# Patient Record
Sex: Male | Born: 1950 | Race: Black or African American | Hispanic: No | Marital: Married | Smoking: Current every day smoker
Health system: Southern US, Community
[De-identification: ages and names within clinical notes are randomized; demographics above are authoritative.]

## PROBLEM LIST (undated history)

## (undated) DIAGNOSIS — J42 Unspecified chronic bronchitis: Secondary | ICD-10-CM

## (undated) DIAGNOSIS — I480 Paroxysmal atrial fibrillation: Secondary | ICD-10-CM

## (undated) DIAGNOSIS — I4892 Unspecified atrial flutter: Secondary | ICD-10-CM

## (undated) DIAGNOSIS — Z720 Tobacco use: Secondary | ICD-10-CM

## (undated) DIAGNOSIS — I351 Nonrheumatic aortic (valve) insufficiency: Secondary | ICD-10-CM

## (undated) DIAGNOSIS — I2510 Atherosclerotic heart disease of native coronary artery without angina pectoris: Secondary | ICD-10-CM

## (undated) DIAGNOSIS — E785 Hyperlipidemia, unspecified: Secondary | ICD-10-CM

## (undated) DIAGNOSIS — N182 Chronic kidney disease, stage 2 (mild): Secondary | ICD-10-CM

## (undated) DIAGNOSIS — IMO0001 Reserved for inherently not codable concepts without codable children: Secondary | ICD-10-CM

## (undated) DIAGNOSIS — J449 Chronic obstructive pulmonary disease, unspecified: Secondary | ICD-10-CM

## (undated) DIAGNOSIS — I712 Thoracic aortic aneurysm, without rupture: Secondary | ICD-10-CM

## (undated) DIAGNOSIS — I509 Heart failure, unspecified: Secondary | ICD-10-CM

## (undated) DIAGNOSIS — I429 Cardiomyopathy, unspecified: Secondary | ICD-10-CM

## (undated) DIAGNOSIS — I7121 Aneurysm of the ascending aorta, without rupture: Secondary | ICD-10-CM

## (undated) DIAGNOSIS — I10 Essential (primary) hypertension: Secondary | ICD-10-CM

## (undated) HISTORY — DX: Tobacco use: Z72.0

## (undated) HISTORY — DX: Unspecified atrial flutter: I48.92

## (undated) HISTORY — DX: Paroxysmal atrial fibrillation: I48.0

## (undated) HISTORY — DX: Unspecified chronic bronchitis: J42

## (undated) HISTORY — DX: Cardiomyopathy, unspecified: I42.9

## (undated) HISTORY — DX: Aneurysm of the ascending aorta, without rupture: I71.21

## (undated) HISTORY — DX: Atherosclerotic heart disease of native coronary artery without angina pectoris: I25.10

## (undated) HISTORY — DX: Essential (primary) hypertension: I10

## (undated) HISTORY — DX: Thoracic aortic aneurysm, without rupture: I71.2

## (undated) HISTORY — DX: Heart failure, unspecified: I50.9

## (undated) HISTORY — DX: Chronic kidney disease, stage 2 (mild): N18.2

## (undated) HISTORY — DX: Nonrheumatic aortic (valve) insufficiency: I35.1

## (undated) HISTORY — DX: Chronic obstructive pulmonary disease, unspecified: J44.9

## (undated) HISTORY — DX: Hyperlipidemia, unspecified: E78.5

---

## 2002-10-19 HISTORY — PX: ESOPHAGOGASTRODUODENOSCOPY: SHX1529

## 2002-10-19 HISTORY — PX: COLONOSCOPY W/ POLYPECTOMY: SHX1380

## 2003-08-13 ENCOUNTER — Encounter: Payer: Self-pay | Admitting: Family Medicine

## 2003-08-13 LAB — CONVERTED CEMR LAB: Hgb A1c MFr Bld: 5.8 %

## 2005-05-12 ENCOUNTER — Ambulatory Visit: Payer: Self-pay | Admitting: Family Medicine

## 2005-05-15 ENCOUNTER — Ambulatory Visit: Payer: Self-pay | Admitting: Family Medicine

## 2005-05-15 LAB — CONVERTED CEMR LAB: PSA: 0.77 ng/mL

## 2005-05-19 ENCOUNTER — Ambulatory Visit: Payer: Self-pay | Admitting: Family Medicine

## 2005-05-27 ENCOUNTER — Ambulatory Visit: Payer: Self-pay | Admitting: Family Medicine

## 2005-07-17 ENCOUNTER — Ambulatory Visit: Payer: Self-pay | Admitting: Family Medicine

## 2005-10-14 ENCOUNTER — Ambulatory Visit: Payer: Self-pay | Admitting: Family Medicine

## 2005-10-14 LAB — CONVERTED CEMR LAB: Hgb A1c MFr Bld: 5.7 %

## 2005-10-15 ENCOUNTER — Ambulatory Visit: Payer: Self-pay | Admitting: Family Medicine

## 2006-01-15 ENCOUNTER — Ambulatory Visit: Payer: Self-pay | Admitting: Family Medicine

## 2006-05-12 ENCOUNTER — Ambulatory Visit: Payer: Self-pay | Admitting: Family Medicine

## 2006-05-12 LAB — CONVERTED CEMR LAB
Hgb A1c MFr Bld: 5.9 %
PSA: 0.65 ng/mL

## 2006-08-17 ENCOUNTER — Ambulatory Visit: Payer: Self-pay | Admitting: Family Medicine

## 2006-09-28 ENCOUNTER — Emergency Department: Payer: Self-pay | Admitting: Emergency Medicine

## 2006-09-29 HISTORY — PX: CT ABD W & PELVIS WO CM: HXRAD294

## 2006-09-29 HISTORY — PX: OTHER SURGICAL HISTORY: SHX169

## 2006-10-27 ENCOUNTER — Encounter: Payer: Self-pay | Admitting: Family Medicine

## 2006-10-27 DIAGNOSIS — F17200 Nicotine dependence, unspecified, uncomplicated: Secondary | ICD-10-CM

## 2006-10-27 DIAGNOSIS — E119 Type 2 diabetes mellitus without complications: Secondary | ICD-10-CM

## 2006-10-27 DIAGNOSIS — M1710 Unilateral primary osteoarthritis, unspecified knee: Secondary | ICD-10-CM | POA: Insufficient documentation

## 2006-10-27 DIAGNOSIS — E785 Hyperlipidemia, unspecified: Secondary | ICD-10-CM | POA: Insufficient documentation

## 2006-10-27 DIAGNOSIS — I10 Essential (primary) hypertension: Secondary | ICD-10-CM

## 2006-10-27 DIAGNOSIS — E669 Obesity, unspecified: Secondary | ICD-10-CM | POA: Insufficient documentation

## 2006-10-27 DIAGNOSIS — IMO0002 Reserved for concepts with insufficient information to code with codable children: Secondary | ICD-10-CM

## 2006-10-27 DIAGNOSIS — A048 Other specified bacterial intestinal infections: Secondary | ICD-10-CM | POA: Insufficient documentation

## 2006-10-28 ENCOUNTER — Ambulatory Visit: Payer: Self-pay | Admitting: Family Medicine

## 2006-11-06 DIAGNOSIS — D126 Benign neoplasm of colon, unspecified: Secondary | ICD-10-CM

## 2006-12-01 ENCOUNTER — Ambulatory Visit: Payer: Self-pay | Admitting: Family Medicine

## 2006-12-01 LAB — CONVERTED CEMR LAB
ALT: 18 units/L (ref 0–40)
AST: 17 units/L (ref 0–37)
BUN: 15 mg/dL (ref 6–23)
CO2: 33 meq/L — ABNORMAL HIGH (ref 19–32)
Calcium: 9.2 mg/dL (ref 8.4–10.5)
Creatinine, Ser: 1.3 mg/dL (ref 0.4–1.5)
Hgb A1c MFr Bld: 5.9 % (ref 4.6–6.0)
Potassium: 4.1 meq/L (ref 3.5–5.1)
Triglycerides: 74 mg/dL (ref 0–149)
VLDL: 15 mg/dL (ref 0–40)

## 2006-12-08 ENCOUNTER — Ambulatory Visit: Payer: Self-pay | Admitting: Family Medicine

## 2007-02-03 ENCOUNTER — Ambulatory Visit: Payer: Self-pay | Admitting: Family Medicine

## 2007-02-10 ENCOUNTER — Encounter (INDEPENDENT_AMBULATORY_CARE_PROVIDER_SITE_OTHER): Payer: Self-pay | Admitting: *Deleted

## 2007-02-15 ENCOUNTER — Ambulatory Visit: Payer: Self-pay | Admitting: Family Medicine

## 2007-02-15 LAB — CONVERTED CEMR LAB
Calcium: 9.2 mg/dL (ref 8.4–10.5)
Chloride: 105 meq/L (ref 96–112)
GFR calc Af Amer: 58 mL/min
GFR calc non Af Amer: 48 mL/min
Glucose, Bld: 93 mg/dL (ref 70–99)
Sodium: 141 meq/L (ref 135–145)

## 2007-02-16 ENCOUNTER — Other Ambulatory Visit: Payer: Self-pay

## 2007-02-17 ENCOUNTER — Inpatient Hospital Stay: Payer: Self-pay | Admitting: *Deleted

## 2007-02-17 DIAGNOSIS — I428 Other cardiomyopathies: Secondary | ICD-10-CM | POA: Insufficient documentation

## 2007-02-18 ENCOUNTER — Other Ambulatory Visit: Payer: Self-pay

## 2007-02-19 ENCOUNTER — Other Ambulatory Visit: Payer: Self-pay

## 2007-02-22 ENCOUNTER — Other Ambulatory Visit: Payer: Self-pay

## 2007-02-24 ENCOUNTER — Encounter: Payer: Self-pay | Admitting: Family Medicine

## 2007-02-28 ENCOUNTER — Telehealth (INDEPENDENT_AMBULATORY_CARE_PROVIDER_SITE_OTHER): Payer: Self-pay | Admitting: *Deleted

## 2007-02-28 ENCOUNTER — Ambulatory Visit: Payer: Self-pay | Admitting: Family Medicine

## 2007-02-28 DIAGNOSIS — I4891 Unspecified atrial fibrillation: Secondary | ICD-10-CM | POA: Insufficient documentation

## 2007-02-28 LAB — CONVERTED CEMR LAB: Prothrombin Time: 13.6 s

## 2007-03-02 ENCOUNTER — Encounter: Payer: Self-pay | Admitting: Family Medicine

## 2007-03-02 ENCOUNTER — Ambulatory Visit: Payer: Self-pay | Admitting: Internal Medicine

## 2007-03-03 ENCOUNTER — Ambulatory Visit: Payer: Self-pay | Admitting: Family Medicine

## 2007-03-03 ENCOUNTER — Telehealth (INDEPENDENT_AMBULATORY_CARE_PROVIDER_SITE_OTHER): Payer: Self-pay | Admitting: *Deleted

## 2007-03-03 DIAGNOSIS — R5383 Other fatigue: Secondary | ICD-10-CM

## 2007-03-03 DIAGNOSIS — R5381 Other malaise: Secondary | ICD-10-CM | POA: Insufficient documentation

## 2007-03-04 ENCOUNTER — Ambulatory Visit: Payer: Self-pay | Admitting: Internal Medicine

## 2007-03-06 LAB — CONVERTED CEMR LAB
ALT: 28 U/L
AST: 26 U/L
Albumin: 3.6 g/dL
Alkaline Phosphatase: 80 U/L
BUN: 19 mg/dL
Bilirubin, Direct: 0.1 mg/dL
CO2: 30 meq/L
Calcium: 9.1 mg/dL
Chloride: 107 meq/L
Cholesterol: 157 mg/dL
Creatinine, Ser: 1.5 mg/dL
GFR calc Af Amer: 63 mL/min
GFR calc non Af Amer: 52 mL/min
Glucose, Bld: 93 mg/dL
HDL: 27.7 mg/dL — ABNORMAL LOW
Hgb A1c MFr Bld: 6 %
LDL Cholesterol: 113 mg/dL — ABNORMAL HIGH
Magnesium: 2.3 mg/dL
Phosphorus: 4.2 mg/dL
Potassium: 4.3 meq/L
Sodium: 142 meq/L
TSH: 1.21 u[IU]/mL
Total Bilirubin: 0.6 mg/dL
Total CHOL/HDL Ratio: 5.7
Total Protein: 6.8 g/dL
Triglycerides: 83 mg/dL
VLDL: 17 mg/dL

## 2007-03-07 ENCOUNTER — Ambulatory Visit: Payer: Self-pay | Admitting: Family Medicine

## 2007-03-07 LAB — CONVERTED CEMR LAB
INR: 1.2
Prothrombin Time: 12.6 s

## 2007-03-11 ENCOUNTER — Encounter: Payer: Self-pay | Admitting: Family Medicine

## 2007-03-14 ENCOUNTER — Encounter (INDEPENDENT_AMBULATORY_CARE_PROVIDER_SITE_OTHER): Payer: Self-pay | Admitting: *Deleted

## 2007-03-23 ENCOUNTER — Ambulatory Visit: Payer: Self-pay | Admitting: Family Medicine

## 2007-03-23 LAB — CONVERTED CEMR LAB
INR: 0.9
Prothrombin Time: 11.9 s

## 2007-03-25 ENCOUNTER — Telehealth: Payer: Self-pay | Admitting: Family Medicine

## 2007-03-28 ENCOUNTER — Ambulatory Visit: Payer: Self-pay | Admitting: Family Medicine

## 2007-04-05 ENCOUNTER — Telehealth (INDEPENDENT_AMBULATORY_CARE_PROVIDER_SITE_OTHER): Payer: Self-pay | Admitting: *Deleted

## 2007-04-05 ENCOUNTER — Ambulatory Visit: Payer: Self-pay | Admitting: Family Medicine

## 2007-04-05 LAB — CONVERTED CEMR LAB: Prothrombin Time: 12.3 s

## 2007-04-19 ENCOUNTER — Ambulatory Visit: Payer: Self-pay | Admitting: Family Medicine

## 2007-04-20 LAB — CONVERTED CEMR LAB
INR: 0.9
Prothrombin Time: 11.3 s

## 2007-04-25 ENCOUNTER — Ambulatory Visit: Payer: Self-pay | Admitting: Family Medicine

## 2007-04-25 LAB — CONVERTED CEMR LAB
Creatinine, Ser: 1.2 mg/dL (ref 0.4–1.5)
GFR calc non Af Amer: 67 mL/min
Glucose, Bld: 100 mg/dL — ABNORMAL HIGH (ref 70–99)
Hgb A1c MFr Bld: 6.5 % — ABNORMAL HIGH (ref 4.6–6.0)
Potassium: 3.9 meq/L (ref 3.5–5.1)
Sodium: 142 meq/L (ref 135–145)

## 2007-04-27 ENCOUNTER — Ambulatory Visit: Payer: Self-pay | Admitting: Family Medicine

## 2007-04-28 ENCOUNTER — Telehealth: Payer: Self-pay | Admitting: Family Medicine

## 2007-04-28 ENCOUNTER — Ambulatory Visit: Payer: Self-pay | Admitting: Family Medicine

## 2007-05-11 ENCOUNTER — Ambulatory Visit: Payer: Self-pay | Admitting: Family Medicine

## 2007-05-11 LAB — CONVERTED CEMR LAB: INR: 1.1

## 2007-05-25 ENCOUNTER — Ambulatory Visit: Payer: Self-pay | Admitting: Family Medicine

## 2007-05-25 LAB — CONVERTED CEMR LAB: INR: 1.1

## 2007-05-31 ENCOUNTER — Ambulatory Visit: Payer: Self-pay | Admitting: Family Medicine

## 2007-05-31 LAB — CONVERTED CEMR LAB
INR: 1.3
Prothrombin Time: 14.1 s

## 2007-06-07 ENCOUNTER — Ambulatory Visit: Payer: Self-pay | Admitting: Family Medicine

## 2007-06-07 LAB — CONVERTED CEMR LAB

## 2007-06-10 ENCOUNTER — Ambulatory Visit: Payer: Self-pay | Admitting: Internal Medicine

## 2007-06-10 ENCOUNTER — Encounter: Payer: Self-pay | Admitting: Family Medicine

## 2007-06-14 ENCOUNTER — Ambulatory Visit: Payer: Self-pay | Admitting: Family Medicine

## 2007-06-15 ENCOUNTER — Encounter: Payer: Self-pay | Admitting: Family Medicine

## 2007-06-15 HISTORY — PX: OTHER SURGICAL HISTORY: SHX169

## 2007-06-30 ENCOUNTER — Ambulatory Visit: Payer: Self-pay | Admitting: Family Medicine

## 2007-07-12 ENCOUNTER — Ambulatory Visit: Payer: Self-pay | Admitting: Family Medicine

## 2007-07-12 LAB — CONVERTED CEMR LAB: INR: 2

## 2007-09-14 ENCOUNTER — Ambulatory Visit: Payer: Self-pay | Admitting: Family Medicine

## 2007-09-14 LAB — CONVERTED CEMR LAB: Prothrombin Time: 15.8 s

## 2007-12-22 ENCOUNTER — Ambulatory Visit: Payer: Self-pay | Admitting: Family Medicine

## 2007-12-22 LAB — CONVERTED CEMR LAB
AST: 20 units/L (ref 0–37)
Albumin: 4 g/dL (ref 3.5–5.2)
BUN: 9 mg/dL (ref 6–23)
Basophils Absolute: 0 10*3/uL (ref 0.0–0.1)
Basophils Relative: 0.4 % (ref 0.0–1.0)
Calcium: 9.3 mg/dL (ref 8.4–10.5)
Cholesterol: 182 mg/dL (ref 0–200)
Creatinine, Ser: 1.4 mg/dL (ref 0.4–1.5)
Creatinine,U: 26.1 mg/dL
Eosinophils Absolute: 0.1 10*3/uL (ref 0.0–0.7)
Eosinophils Relative: 1.7 % (ref 0.0–5.0)
GFR calc non Af Amer: 56 mL/min
HCT: 49.4 % (ref 39.0–52.0)
Hemoglobin: 17 g/dL (ref 13.0–17.0)
Hgb A1c MFr Bld: 6.2 % — ABNORMAL HIGH (ref 4.6–6.0)
MCHC: 34.5 g/dL (ref 30.0–36.0)
MCV: 95.3 fL (ref 78.0–100.0)
Microalb, Ur: 5.2 mg/dL — ABNORMAL HIGH (ref 0.0–1.9)
Monocytes Absolute: 0.6 10*3/uL (ref 0.1–1.0)
Neutro Abs: 3.8 10*3/uL (ref 1.4–7.7)
Neutrophils Relative %: 55.7 % (ref 43.0–77.0)
PSA: 0.91 ng/mL (ref 0.10–4.00)
RBC: 5.19 M/uL (ref 4.22–5.81)
Total Bilirubin: 0.8 mg/dL (ref 0.3–1.2)
VLDL: 34 mg/dL (ref 0–40)
WBC: 6.8 10*3/uL (ref 4.5–10.5)

## 2007-12-26 ENCOUNTER — Ambulatory Visit: Payer: Self-pay | Admitting: Family Medicine

## 2008-06-23 ENCOUNTER — Inpatient Hospital Stay: Payer: Self-pay | Admitting: Specialist

## 2008-06-25 ENCOUNTER — Encounter: Payer: Self-pay | Admitting: Family Medicine

## 2008-06-27 ENCOUNTER — Ambulatory Visit: Payer: Self-pay | Admitting: Family Medicine

## 2008-06-27 LAB — CONVERTED CEMR LAB: INR: 1.4

## 2008-06-28 LAB — CONVERTED CEMR LAB
Creatinine,U: 280.5 mg/dL
Hgb A1c MFr Bld: 6.1 % — ABNORMAL HIGH (ref 4.6–6.0)
Microalb Creat Ratio: 84.1 mg/g — ABNORMAL HIGH (ref 0.0–30.0)

## 2008-07-04 ENCOUNTER — Ambulatory Visit: Payer: Self-pay | Admitting: Family Medicine

## 2008-07-04 LAB — CONVERTED CEMR LAB: Prothrombin Time: 19.5 s
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
## 2008-08-03 ENCOUNTER — Ambulatory Visit: Payer: Self-pay | Admitting: Family Medicine

## 2008-08-03 ENCOUNTER — Telehealth: Payer: Self-pay | Admitting: Family Medicine

## 2008-08-03 LAB — CONVERTED CEMR LAB
INR: 1.2
Prothrombin Time: 13.7 s

## 2008-08-09 ENCOUNTER — Encounter: Payer: Self-pay | Admitting: Family Medicine

## 2008-08-15 ENCOUNTER — Ambulatory Visit: Payer: Self-pay | Admitting: Family Medicine

## 2008-09-05 ENCOUNTER — Ambulatory Visit: Payer: Self-pay | Admitting: Family Medicine

## 2008-09-05 LAB — CONVERTED CEMR LAB
INR: 1.7
Prothrombin Time: 15.9 s

## 2008-09-19 ENCOUNTER — Ambulatory Visit: Payer: Self-pay | Admitting: Family Medicine

## 2008-09-19 LAB — CONVERTED CEMR LAB: INR: 1.8

## 2008-10-03 ENCOUNTER — Ambulatory Visit: Payer: Self-pay | Admitting: Family Medicine

## 2008-10-03 LAB — CONVERTED CEMR LAB
INR: 1.3
Prothrombin Time: 14.3 s

## 2008-10-04 ENCOUNTER — Emergency Department: Payer: Self-pay | Admitting: Emergency Medicine

## 2008-10-04 ENCOUNTER — Ambulatory Visit: Payer: Self-pay | Admitting: Family Medicine

## 2008-10-04 DIAGNOSIS — R0602 Shortness of breath: Secondary | ICD-10-CM

## 2008-10-12 ENCOUNTER — Inpatient Hospital Stay: Payer: Self-pay | Admitting: Internal Medicine

## 2008-10-15 ENCOUNTER — Encounter: Payer: Self-pay | Admitting: Family Medicine

## 2008-10-22 ENCOUNTER — Encounter: Payer: Self-pay | Admitting: Family Medicine

## 2008-10-23 ENCOUNTER — Encounter: Payer: Self-pay | Admitting: Family Medicine

## 2008-10-24 ENCOUNTER — Ambulatory Visit: Payer: Self-pay | Admitting: Family Medicine

## 2008-10-24 LAB — CONVERTED CEMR LAB
CO2: 31 meq/L (ref 19–32)
Calcium: 8.9 mg/dL (ref 8.4–10.5)
GFR calc non Af Amer: 57.85 mL/min (ref >60)
Glucose, Bld: 104 mg/dL — ABNORMAL HIGH (ref 70–99)
Potassium: 4.1 meq/L (ref 3.5–5.1)
Pro B Natriuretic peptide (BNP): 560 pg/mL — ABNORMAL HIGH (ref 0.0–100.0)
Sodium: 144 meq/L (ref 135–145)

## 2008-10-25 ENCOUNTER — Ambulatory Visit: Payer: Self-pay | Admitting: Cardiology

## 2008-10-25 LAB — CONVERTED CEMR LAB
Beta Globulin: 6 % (ref 4.7–7.2)
Gamma Globulin: 13.9 % (ref 11.1–18.8)
IgA: 312 mg/dL (ref 68–378)
IgG (Immunoglobin G), Serum: 834 mg/dL (ref 694–1618)
TSH: 1.319 microintl units/mL (ref 0.350–4.500)
Total Protein, Serum Electrophoresis: 6 g/dL (ref 6.0–8.3)

## 2008-10-26 ENCOUNTER — Ambulatory Visit: Payer: Self-pay | Admitting: Nephrology

## 2008-10-29 ENCOUNTER — Ambulatory Visit: Payer: Self-pay | Admitting: Internal Medicine

## 2008-10-31 ENCOUNTER — Ambulatory Visit: Payer: Self-pay | Admitting: Family Medicine

## 2008-10-31 LAB — CONVERTED CEMR LAB: Prothrombin Time: 15.4 s

## 2008-11-07 ENCOUNTER — Ambulatory Visit: Payer: Self-pay | Admitting: Cardiology

## 2008-11-12 ENCOUNTER — Ambulatory Visit: Payer: Self-pay | Admitting: Internal Medicine

## 2008-11-12 ENCOUNTER — Encounter: Payer: Self-pay | Admitting: Cardiology

## 2008-11-12 LAB — CONVERTED CEMR LAB
BUN: 9 mg/dL (ref 6–23)
Calcium: 8.9 mg/dL (ref 8.4–10.5)
Creatinine, Ser: 1.32 mg/dL (ref 0.40–1.50)
INR: 1.6 — ABNORMAL HIGH (ref 0.0–1.5)
MCHC: 32.2 g/dL (ref 30.0–36.0)
MCV: 94.8 fL (ref 78.0–100.0)
Platelets: 276 10*3/uL (ref 150–400)
Prothrombin Time: 19.3 s — ABNORMAL HIGH (ref 11.6–15.2)

## 2008-11-14 ENCOUNTER — Inpatient Hospital Stay (HOSPITAL_COMMUNITY): Admission: AD | Admit: 2008-11-14 | Discharge: 2008-11-16 | Payer: Self-pay | Admitting: Internal Medicine

## 2008-11-14 ENCOUNTER — Telehealth: Payer: Self-pay | Admitting: Family Medicine

## 2008-11-14 ENCOUNTER — Encounter: Payer: Self-pay | Admitting: Internal Medicine

## 2008-11-14 ENCOUNTER — Ambulatory Visit: Payer: Self-pay | Admitting: Internal Medicine

## 2008-11-14 HISTORY — PX: TEE WITHOUT CARDIOVERSION: SHX5443

## 2008-11-21 ENCOUNTER — Telehealth: Payer: Self-pay | Admitting: Family Medicine

## 2008-11-22 ENCOUNTER — Ambulatory Visit: Payer: Self-pay | Admitting: Cardiology

## 2008-11-22 ENCOUNTER — Encounter (INDEPENDENT_AMBULATORY_CARE_PROVIDER_SITE_OTHER): Payer: Self-pay | Admitting: *Deleted

## 2008-12-05 ENCOUNTER — Ambulatory Visit: Payer: Self-pay | Admitting: Family Medicine

## 2008-12-05 LAB — CONVERTED CEMR LAB
INR: 1.1
Prothrombin Time: 13.2 s

## 2008-12-06 ENCOUNTER — Ambulatory Visit: Payer: Self-pay | Admitting: Cardiology

## 2008-12-11 ENCOUNTER — Ambulatory Visit: Payer: Self-pay | Admitting: Family Medicine

## 2008-12-11 LAB — CONVERTED CEMR LAB
INR: 1.3
Prothrombin Time: 14.2 s

## 2008-12-13 LAB — CONVERTED CEMR LAB
CO2: 26 meq/L (ref 19–32)
Calcium: 8.9 mg/dL (ref 8.4–10.5)
Creatinine, Ser: 1.19 mg/dL (ref 0.40–1.50)

## 2008-12-18 ENCOUNTER — Ambulatory Visit: Payer: Self-pay

## 2008-12-18 ENCOUNTER — Encounter: Payer: Self-pay | Admitting: Cardiology

## 2008-12-24 ENCOUNTER — Telehealth: Payer: Self-pay | Admitting: Cardiology

## 2008-12-25 ENCOUNTER — Ambulatory Visit: Payer: Self-pay | Admitting: Cardiology

## 2008-12-27 ENCOUNTER — Encounter: Payer: Self-pay | Admitting: Cardiology

## 2009-01-01 ENCOUNTER — Ambulatory Visit: Payer: Self-pay | Admitting: Cardiology

## 2009-01-07 ENCOUNTER — Ambulatory Visit: Payer: Self-pay | Admitting: Internal Medicine

## 2009-01-07 ENCOUNTER — Encounter: Payer: Self-pay | Admitting: Cardiology

## 2009-01-08 LAB — CONVERTED CEMR LAB
CO2: 23 meq/L (ref 19–32)
Calcium: 9.1 mg/dL (ref 8.4–10.5)
MCV: 92.5 fL (ref 78.0–100.0)
Potassium: 3.9 meq/L (ref 3.5–5.3)
Sodium: 141 meq/L (ref 135–145)
WBC: 6.4 10*3/uL (ref 4.0–10.5)

## 2009-01-10 ENCOUNTER — Ambulatory Visit: Payer: Self-pay | Admitting: Cardiovascular Disease

## 2009-01-10 ENCOUNTER — Encounter (INDEPENDENT_AMBULATORY_CARE_PROVIDER_SITE_OTHER): Payer: Self-pay | Admitting: *Deleted

## 2009-01-11 ENCOUNTER — Ambulatory Visit: Payer: Self-pay | Admitting: Cardiology

## 2009-01-11 ENCOUNTER — Ambulatory Visit (HOSPITAL_COMMUNITY): Admission: RE | Admit: 2009-01-11 | Discharge: 2009-01-11 | Payer: Self-pay | Admitting: Cardiology

## 2009-01-11 HISTORY — PX: CARDIAC CATHETERIZATION: SHX172

## 2009-01-17 ENCOUNTER — Ambulatory Visit: Payer: Self-pay | Admitting: Internal Medicine

## 2009-01-31 ENCOUNTER — Ambulatory Visit: Payer: Self-pay | Admitting: Cardiology

## 2009-02-01 ENCOUNTER — Encounter: Payer: Self-pay | Admitting: Internal Medicine

## 2009-02-14 ENCOUNTER — Ambulatory Visit: Payer: Self-pay | Admitting: Cardiology

## 2009-02-20 ENCOUNTER — Ambulatory Visit: Payer: Self-pay | Admitting: Gastroenterology

## 2009-02-21 ENCOUNTER — Encounter: Payer: Self-pay | Admitting: Cardiology

## 2009-02-27 ENCOUNTER — Telehealth: Payer: Self-pay | Admitting: Cardiology

## 2009-02-28 ENCOUNTER — Ambulatory Visit: Payer: Self-pay | Admitting: Cardiovascular Disease

## 2009-03-04 ENCOUNTER — Encounter: Payer: Self-pay | Admitting: *Deleted

## 2009-03-07 ENCOUNTER — Ambulatory Visit: Payer: Self-pay | Admitting: Cardiology

## 2009-03-07 DIAGNOSIS — I4892 Unspecified atrial flutter: Secondary | ICD-10-CM

## 2009-03-07 DIAGNOSIS — I359 Nonrheumatic aortic valve disorder, unspecified: Secondary | ICD-10-CM | POA: Insufficient documentation

## 2009-03-07 DIAGNOSIS — I719 Aortic aneurysm of unspecified site, without rupture: Secondary | ICD-10-CM | POA: Insufficient documentation

## 2009-03-09 LAB — CONVERTED CEMR LAB
CO2: 25 meq/L (ref 19–32)
Chloride: 105 meq/L (ref 96–112)
Creatinine, Ser: 1.27 mg/dL (ref 0.40–1.50)
HDL: 40 mg/dL (ref >39)
LDL Cholesterol: 95 mg/dL (ref 0–99)
Pro B Natriuretic peptide (BNP): 16.7 pg/mL (ref 0.0–100.0)
Total CHOL/HDL Ratio: 3.8

## 2009-03-11 ENCOUNTER — Telehealth (INDEPENDENT_AMBULATORY_CARE_PROVIDER_SITE_OTHER): Payer: Self-pay | Admitting: *Deleted

## 2009-03-12 ENCOUNTER — Encounter: Payer: Self-pay | Admitting: Cardiology

## 2009-03-14 ENCOUNTER — Encounter: Payer: Self-pay | Admitting: Family Medicine

## 2009-04-11 ENCOUNTER — Encounter: Payer: Self-pay | Admitting: Cardiology

## 2009-06-18 ENCOUNTER — Telehealth (INDEPENDENT_AMBULATORY_CARE_PROVIDER_SITE_OTHER): Payer: Self-pay | Admitting: *Deleted

## 2009-06-20 ENCOUNTER — Telehealth: Payer: Self-pay | Admitting: Cardiology

## 2009-06-20 ENCOUNTER — Ambulatory Visit: Payer: Self-pay | Admitting: Internal Medicine

## 2009-06-27 ENCOUNTER — Encounter: Payer: Self-pay | Admitting: Cardiology

## 2009-06-27 ENCOUNTER — Ambulatory Visit: Payer: Self-pay

## 2009-07-01 ENCOUNTER — Encounter: Payer: Self-pay | Admitting: Cardiology

## 2009-07-03 ENCOUNTER — Telehealth: Payer: Self-pay | Admitting: Cardiology

## 2009-07-03 ENCOUNTER — Ambulatory Visit: Payer: Self-pay | Admitting: Cardiology

## 2009-07-09 LAB — CONVERTED CEMR LAB
ALT: 13 units/L (ref 0–53)
AST: 14 units/L (ref 0–37)
Chloride: 103 meq/L (ref 96–112)
Creatinine, Ser: 1.28 mg/dL (ref 0.40–1.50)
LDL Cholesterol: 88 mg/dL (ref 0–99)
Total Bilirubin: 0.2 mg/dL — ABNORMAL LOW (ref 0.3–1.2)
Total CHOL/HDL Ratio: 4.1
VLDL: 23 mg/dL (ref 0–40)

## 2009-10-09 ENCOUNTER — Telehealth: Payer: Self-pay | Admitting: Cardiology

## 2009-12-17 ENCOUNTER — Telehealth: Payer: Self-pay | Admitting: Cardiology

## 2010-06-19 ENCOUNTER — Encounter: Payer: Self-pay | Admitting: Cardiology

## 2010-06-19 ENCOUNTER — Ambulatory Visit: Payer: Self-pay

## 2010-08-10 ENCOUNTER — Encounter: Payer: Self-pay | Admitting: Cardiology

## 2010-08-19 NOTE — Progress Notes (Signed)
Summary: Furosemide refill  Phone Note Refill Request Call back at Home Phone [phone] Message from:  Patient on Dec 17, 2009 11:03 AM  Refills Requested: Medication #1:  FUROSEMIDE 40 MG  TABS 1TWICE A DAY BY MOUTH [facility] ON [address] IN [place]  Initial call taken by: Harlon Flor,  Dec 17, 2009 11:03 AM  Follow-up for Phone Call        Rx refill sent to pharmacy electronically.  Attempted to notify pt.  LMOM rx refill sent to pharmacy. Follow-up by: Cloyde Reams RN,  Dec 17, 2009 12:11 PM    Prescriptions: FUROSEMIDE 40 MG  TABS (FUROSEMIDE) 1TWICE A DAY BY MOUTH  #60 x 6   Entered by:   Cloyde Reams RN   Authorized by:   Marca Ancona, MD   Signed by:   Cloyde Reams RN on 12/17/2009   Method used:   Electronically to        [facility] [address] (retail)       [address]       [place], [place]  [address]       Ph: [phone]       Fax: [phone]   RxID:   [id number]

## 2010-08-19 NOTE — Progress Notes (Signed)
Summary: Refill  Phone Note Refill Request Message from:  Patient on October 09, 2009 11:59 AM  Refills Requested: Medication #1:  SPIRONOLACTONE 25 MG TABS Take 1/2 tablet by mouth daily [facility]  Initial call taken by: [place],  October 09, 2009 11:59 AM    Prescriptions: SPIRONOLACTONE 25 MG TABS (SPIRONOLACTONE) Take 1/2 tablet by mouth daily  #15 x 6   Entered by:   Mercer Pod   Authorized by:   Marca Ancona, MD   Signed by:   Mercer Pod on 10/09/2009   Method used:   Electronically to        [facility] [address] (retail)       [address]       [place], [place]  [address]       Ph: [phone]       Fax: [phone]   RxID:   [id number]

## 2010-10-13 ENCOUNTER — Telehealth: Payer: Self-pay

## 2010-10-14 ENCOUNTER — Other Ambulatory Visit: Payer: Self-pay

## 2010-10-14 ENCOUNTER — Telehealth: Payer: Self-pay

## 2010-10-14 MED ORDER — AMLODIPINE BESYLATE 5 MG PO TABS: 5.0000 mg | ORAL_TABLET | Freq: Every day | ORAL | Status: DC

## 2010-10-14 NOTE — Telephone Encounter (Signed)
Patient needed a refill for norvasc 5 mg daily.  Told pharmacy need to have patient make an appointment with Dr. Shirlee Latch as soon as possible before any refills done.

## 2010-10-14 NOTE — Telephone Encounter (Signed)
Needs a refill on Norvasc 5 mg one tablet daily. Spoke with [facility] for the refill. The patient has made an appointment with Dr. Shirlee Latch.

## 2010-10-15 ENCOUNTER — Other Ambulatory Visit: Payer: Self-pay

## 2010-10-15 MED ORDER — AMLODIPINE BESYLATE 5 MG PO TABS: 5.0000 mg | ORAL_TABLET | Freq: Every day | ORAL | Status: DC

## 2010-10-29 LAB — BASIC METABOLIC PANEL
GFR calc non Af Amer: >60 mL/min (ref >60)
Glucose, Bld: 96 mg/dL (ref 70–99)
Potassium: 3.7 mEq/L (ref 3.5–5.1)
Sodium: 142 mEq/L (ref 135–145)

## 2010-10-29 LAB — CBC
HCT: 38.3 % — ABNORMAL LOW (ref 39.0–52.0)
HCT: 39.8 % (ref 39.0–52.0)
HCT: 43.3 % (ref 39.0–52.0)
Hemoglobin: 13.1 g/dL (ref 13.0–17.0)
Hemoglobin: 14.6 g/dL (ref 13.0–17.0)
MCHC: 34 g/dL (ref 30.0–36.0)
MCHC: 34.2 g/dL (ref 30.0–36.0)
MCV: 92.8 fL (ref 78.0–100.0)
MCV: 93 fL (ref 78.0–100.0)
Platelets: 164 10*3/uL (ref 150–400)
Platelets: 242 10*3/uL (ref 150–400)
RDW: 14.7 % (ref 11.5–15.5)
RDW: 14.9 % (ref 11.5–15.5)
RDW: 15.1 % (ref 11.5–15.5)

## 2010-10-29 LAB — PROTIME-INR
INR: 1.8 — ABNORMAL HIGH (ref 0.00–1.49)
INR: 2.2 — ABNORMAL HIGH (ref 0.00–1.49)

## 2010-10-29 LAB — HEPARIN LEVEL (UNFRACTIONATED): Heparin Unfractionated: 0.16 IU/mL — ABNORMAL LOW (ref 0.30–0.70)

## 2010-11-27 ENCOUNTER — Ambulatory Visit: Payer: Self-pay | Admitting: Cardiology

## 2010-12-02 NOTE — Op Note (Signed)
NAME:  Bobby Gray, Bobby Gray NO.:  [id number]   MEDICAL RECORD NO.:  [id number]          PATIENT TYPE:  OIB   LOCATION:  3735                         FACILITY:  [facility]   PHYSICIAN:  Duke Salvia, MD, FACCDATE OF BIRTH:  01/30/1952   DATE OF PROCEDURE:  11/14/2008  DATE OF DISCHARGE:                               OPERATIVE REPORT   PREOPERATIVE DIAGNOSIS:  Atrial flutter.   POSTOPERATIVE DIAGNOSIS:  Atrial flutter.   PROCEDURE:  Invasive electrophysiological study, substrate mapping, and  radiofrequency catheter ablation.   Following obtaining informed consent, the patient was brought to the  Electrophysiology Laboratory and placed on the fluoroscopic table in the  supine position.  After routine prep and drape, cardiac catheterization  was performed with local anesthesia and conscious sedation.  Noninvasive  blood pressure monitoring, transcutaneous oxygen saturation monitoring,  and end-tidal CO2 monitoring were performed continuously throughout the  procedure.  Following the procedure, the catheters were removed and the  patient was transferred to the holding area for sheath removal.   Catheters of 5-French quadripolar catheter was inserted via the left  femoral vein to the AV junction.   A 6-French octapolar catheter was inserted via the right femoral vein to  the coronary sinus.   A 7-French dual decapolar catheter was inserted via the femoral vein to  the right tricuspid annulus and the coronary sinus.   A 8-mm RF catheter was inserted via the right femoral vein to mapping  sites in the isthmus.   Surface lead I, aVF, and V1 were monitored continuously throughout the  procedure.  Following insertion of the catheters, stimulation protocol  included:  1. Incremental atrial pacing.  2. Incremental ventricular pacing.  3. Single atrial extrastimuli.  4. Single ventricular extrastimuli.  5. Entrainment mapping from the isthmus.   END-TIDAL RESULTS:   End-tidal surface electrocardiogram:   Initial:  Rhythm:  Atrial flutter; RR interval 664 milliseconds; AA interval 261  milliseconds; QRS duration:  123 milliseconds; QT interval/M;  AH interval 108 milliseconds; HV interval 67 milliseconds.   Final:  Rhythm:  Sinus; RR interval 724 milliseconds; PR interval 87  milliseconds; QRS duration:  86 milliseconds; QT interval 365  milliseconds; P-wave duration 89 milliseconds; bundle-branch block:  Absent;   AH interval:  87 milliseconds; HV interval:  50 milliseconds.   AV Wenckebach was 300 milliseconds.  VA Wenckebach was less than 300  milliseconds.  AV nodal effective refractory period at paced cycle  length of 600 milliseconds with 300 milliseconds and AV nodal conduction  was continuous antegrade and retrograde.   Accessory pathway function:  No evidence of an accessory pathway was  identified.   Ventricular response programmed stimulation:  Normal for ventricular  stimulation as described.   Arrhythmias induced:  The patient presented to the lab in atrial flutter.  Entrainment mapping  from the cavotricuspid isthmus had a tachycardia cycle length of 260  with a return cycle length of 260 with entrainment pacing at 240  milliseconds.   Fluoroscopy time, a total 10 minutes and 46 seconds of fluoroscopy was  utilized at 7.5  frames per second.   Radiofrequency energy, a total of 7 minutes and 33 seconds of radio wave  energy was applied across the cavotricuspid isthmus.  Initial  applications were placed quite medially.  The second set of applications  were placed at about 6 o'clock and cavotricuspid isthmus block was  accomplished at the lip of the isthmus/IVC border.   IMPRESSION:  1. Normal sinus function.  2. Abnormal atrial function manifested by sustained atrial flutter.  3. Normal His-Purkinje system and function apart from mild      prolongation of HV interval.  4. Normal atrioventricular nodal function.  5. No  accessory pathway.  6. Normal ventricular response to programmed stimulation.   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  Radiofrequency energy applied across the isthmus, successfully  interrupted conduction terminating the flutter and eliminating with  subsequent RF cavotricuspid isthmus conduction.  The S1-A2 interval was  approximately 205 milliseconds following ablation.   The patient tolerated the procedure with some pain, but without apparent  complication.      Duke Salvia, MD, [facility]  Electronically Signed     SCK/MEDQ  D:  11/14/2008  T:  11/15/2008  Job:  [id number]

## 2010-12-02 NOTE — Letter (Signed)
October 29, 2008    Marca Ancona, MD  [address]  [place], [place] [address]   RE:  Bobby Gray, Bobby Gray  MRN:  [id number]  /  DOB:  01/30/1952   Dear Freida Busman,   It was a pleasure seeing Bobby Gray today at your request regarding his  atrial flutter.   As you know, he has had atrial flutter for some time.  The duration of  which is not clear to me, but was started on sotalol by Dr. Juliann Pares and  cardioverted though he has failed to hold sinus rhythm and has not been  able to afford his sotalol, so stopped taking it.   He was recently hospitalized for congestive heart failure over at [facility]  and at that time was significantly hypertensive.  He was found to have  LV dysfunction with ejection fraction of less than 25%, severe left  ventricular hypertrophy as well as modest coronary artery disease for  which his cardiomyopathy is felt to be out of proportion.  He has  remained in atrial flutter largely unaware of his arrhythmia.   His congestive symptoms are quite striking.  He has clearly class III.  He is able to walk 100 yards on occasion.  He has some resting dyspnea.  He has 2-3 pillow orthopnea, but denies nocturnal dyspnea, some  peripheral edema.   His thromboembolic risk factors are notable for:  1. Congestive heart failure.  2. Hypertension.  3. Diabetes.   His INRs have been all over the place.  He apparently when he was  hospitalized a couple of weeks ago, his Coumadin levels were decreased  from 10 to 4 and he was found to be subtherapeutic last week and they  were up titrated again.   His past medical history in addition to above is notable for his:  1. Coronary artery disease with a 95% proximal circumflex described by      Dr. Juliann Pares to manage medically.  2. Chronic kidney disease with creatinine of 1.6.    His past medical history in addition to above is notable for GE reflux  disease and obesity.   SOCIAL HISTORY:  He is separated.  He has 2 children.   He does not use  recreational drugs or alcohol.  He has currently been laid off.   FAMILY HISTORY:  Noncontributory.   REVIEW OF SYSTEMS:  Broadly negative across multiple organ systems  except as noted above.   PHYSICAL EXAMINATION:  VITAL SIGNS:  Today his blood pressure was 110/60  with a pulse of 117 which was irregular.  His weight was 221.  GENERAL:  He is in no obvious distress.  An African American male  appearing somewhat older than his stated age of 7.  HEENT:  No xanthoma, poor dentition.  NECK:  His neck veins were 10-11 cm.  His carotids are brisk and full  bilaterally without bruits.  BACK:  Without kyphosis or scoliosis.  LUNGS:  Lung sounds were somewhat diminished with baseline crackles.  CARDIAC:  The heart sounds were irregular with a displaced and sustained  PMI.  There is a 2/6 systolic murmur heard along the right sternal  border.  ABDOMEN:  Soft with right upper quadrant tenderness.  Femoral pulses  were 2+.  Distal pulses were intact.  EXTREMITIES:  There is no clubbing or cyanosis, 1+ peripheral edema.  NEUROLOGIC:  Grossly normal.  SKIN:  Warm and dry.  LYMPHATICS:  Lymphadenopathy was not appreciated.   Electrocardiogram  dated today demonstrated atrial flutter.  Atrial cycle  length of 250 milliseconds.  Ventricular rate was 117 with intervals of  -/0.9/0.30, the axis was leftward -21.   IMPRESSION:  1. Atrial flutter - recurrent and persistent.  2. Congestive heart failure - acute on chronic - systolic.  Question      related to atrial flutter.  3. Cardiomyopathy, ischemic and nonischemic with a 95% circumflex,      severe left ventricular hypertrophy and a question of tachycardia      component.  4. Thromboembolic risk factors known for:      a.     Hypertension.      b.     Diabetes.      c.     Heart failure with recent subtherapeutic INR.  5. Ongoing cigarette use, although minimal.   [name], Bobby Gray, has atrial flutter and I agree with  you that  catheter ablation should be our primary therapy.  It may allow to get  him off Coumadin and certainly control of his rate as essential it  potentially contributing to his cardiomyopathy and his congestive heart  failure.  I have reviewed with him the potential benefits as well as  potential risks including but not limited to death, perforation, and  bleeding and he understands and would like to proceed.   Given his subtherapeutic INR last week, we will need to proceed with TE-  guided clearance and this is scheduled for November 08, 2008.   This will be done on a therapeutic INR hopefully in the range of 2 to  2.5.   We will probably want a plan to check his INR the day or so before to  make sure that we are close to range.   Thanks very much for the consultation.    Sincerely,      Duke Salvia, MD, [facility]  Electronically Signed    SCK/MedQ  DD: 10/29/2008  DT: 10/30/2008  Job #: [id number]

## 2010-12-02 NOTE — Cardiovascular Report (Signed)
NAME:  Bobby Gray, Bobby Gray NO.:  [id number]   MEDICAL RECORD NO.:  [id number]          PATIENT TYPE:  OIB   LOCATION:  2899                         FACILITY:  [facility]   PHYSICIAN:  Marca Ancona, MD      DATE OF BIRTH:  01/30/1952   DATE OF PROCEDURE:  01/11/2009  DATE OF DISCHARGE:                            CARDIAC CATHETERIZATION   PRIMARY CARE PHYSICIAN:  Arta Silence, MD   PROCEDURES:  1. Left heart catheterization.  2. Coronary angiography  3. Left ventriculography.  4. Aortic root shot.   INDICATION:  This is a 60 year old with history of cardiomyopathy, some  chest discomfort with exertion, and a prior history of a reported 95%  ostial circumflex stenosis.   PROCEDURE NOTE:  After informed consent was obtained, the right groin  was sterilely prepped and draped, and 1% lidocaine was used to locally  anesthetize the right groin area.  The right common femoral artery was  accessed using Seldinger technique and a 5-French arterial sheath was  placed.  The left coronary artery was engaged using the 5-French  multipurpose catheter.  The left ventricle was entered using 5-French  multipurpose catheter.  The right coronary artery was engaged using the  5-French JR4 catheter, and the aortic root shot was entered using 5-  French angled pigtail catheter.   FINDINGS:  1. Hemodynamics; aorta 139/75, left ventricle 131/4.  2. Aortic root shot.  There was mild to most moderate aortic      insufficiency.  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  3. Left ventriculography, EF was noted to be normal and estimated to      be 55%, no observed wall motion abnormalities on this hand      ventriculogram.  4. RCA.  Right coronary artery was a very large-caliber vessel that      wrapped around the apex.  It had luminal irregularities only, no      significant obstructive disease.  5. Left main.  The left main was a large-caliber vessel with no      significant  disease.  6. Left circumflex.  The left circumflex was a relatively large-      caliber vessel given the size of the RCA.  There is no significant      obstructive disease of circumflex, only mild luminal      irregularities.  There was a moderate-sized PL/OM.  7. LAD.  The LAD was a large-caliber vessel.  There is about 30-40%      stenosis in the proximal first diagonal.  Otherwise, there are only      luminal irregularities throughout the length of the LAD.   ASSESSMENT AND PLAN:  No obstructive coronary artery disease.  Normal  left ventricular systolic function, and mild to most moderate aortic  insufficiency.  Some dilatation of the ascending aorta that should be  followed in the future.  There is no lesion to explain chest pain with  exertion.  We will continue medical management as we have been.      Marca Ancona, MD  Electronically Signed     DM/MEDQ  D:  01/11/2009  T:  01/11/2009  Job:  [id number]   cc:   Arta Silence, MD

## 2010-12-02 NOTE — Assessment & Plan Note (Signed)
[facility] OFFICE NOTE   NAME:Bobby, GABLE Gray                        MRN:          [id number]  DATE:11/22/2008                            DOB:          01/30/1952    PRIMARY CARE PHYSICIAN:  Arta Silence, MD   HISTORY OF PRESENT ILLNESS:  This is a 60 year old with history of  systolic congestive heart failure, coronary artery disease, atrial  flutter, hypertension, and chronic kidney disease who returns to  Cardiology Clinic for followup of his congestive heart failure and  atrial flutter.  Since I saw the patient last, he did have his atrial  flutter ablation done at [facility].  EKG was reviewed today.  He is in  normal sinus rhythm today.  He says that he is feeling much better from  a functional standpoint.  He is considerably less short of breath.  He  is able to walk on flat ground without any shortness of breath, walking  up an incline does still get him short of breath.  He continues to sleep  on 2 pillows.  He has had no chest pain.  His blood pressure does  continue to be quite high, today 160/85.  He is cutting back on smoking.  He is down to 2 cigarettes a day.   PAST MEDICAL HISTORY:  1. Congestive heart failure.  The patient's most recent transthoracic      echo was at [facility] December 2009, the EF      was less than 25%, there was severe global hypokinesis.  There was      severe LV dilation with severe LVH.  There was moderate MR.  There      was moderate-to-severe aortic insufficiency.  There was moderate-to-      severe RV dysfunction as well.  The TEE that was done in April 2010      at [facility] corroborated the decreased EF, with estimated EF of      15% on the TEE; however, aortic insufficiency was only mild-to-      moderate and there was only trivial mitral regurgitation.  It has      been thought in the past that the patient's cardiomyopathy was out  of proportion to his degree of coronary artery disease.  He was      never a heavy drinker in the past.  He does have a history of      hypertension that has been quite severe.  His severe LVH with LV      systolic dysfunction could be a burned-out hypertensive      cardiomyopathy, especially given the fact that his blood pressure      continues to be quite high.  We did check a serum protein      electrophoresis, which was normal.  His TSH was normal and HIV was      negative.  It is also possible that the patient has a tachycardia-      mediated cardiomyopathy due to poorly controlled atrial flutter for  a number of years.  2. Atrial flutter.  The patient was in atrial flutter with poorly      controlled rate, apparently for a long period of time.  He did have      a flutter ablation April 2010 and is in sinus rhythm today.  3. Coronary artery disease.  The patient had a left heart      catheterization August 2009. I cannot find the full details of his      heart cath, the only vessel described was 95% proximal circumflex      stenosis.  He was managed medically by Dr. Juliann Pares who was seeing      him at that time and did not think that CAD was the ultimate cause      of his cardiomyopathy.  Additionally, he did not have obstructive      disease in his other vessels.  4. Hypertension.  5. Hyperlipidemia.  6. Type 2 diabetes.  7. Chronic kidney disease, most recent creatinine was 1.1 in April      2010.  8. Active smoker.  The patient is down to smoking 2 cigarettes a week.   MEDICATIONS:  1. Warfarin.  2. Digoxin 0.125 mg daily.  3. Irbesartan 300 mg daily.  4. Hydralazine 100 mg 3 times a day.  5. Imdur 90 mg daily.  6. Aspirin 81 mg daily.  7. Lasix 40 mg b.i.d.  8. Coreg CR 40 mg daily.  9. Lipitor 10 mg daily.   SOCIAL HISTORY:  The patient separated.  He has 2 children.  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  He smokes 2 cigarettes a week.  He smoked  more heavily in the past.  He lives in [place].  He uses no  illicit drugs, not drinking now.  In the past, he drank some alcohol,  but never drank heavily, and never used illicit drugs.   FAMILY HISTORY:  The patient's father had congestive heart failure of  uncertain etiology.  Mother died of 3.  He is unsure why.  He has a  brother who has atrial fibrillation, congestive heart failure, and an  ICD   REVIEW OF SYSTEMS:  All systems were reviewed and were negative except  as noted in the history of present illness.   LABORATORY DATA:  Most recent labs from April 2010, potassium 3.7, and  creatinine 1.14.   PHYSICAL EXAMINATION:  VITAL SIGNS:  Blood pressure is 160/85, weight is  216 pounds, it is down from 223 pounds when I last saw him.  Heart rate  is 70 and regular.  GENERAL:  This is a well developed male, in no apparent distress.  NEUROLOGIC:  Alert and oriented x3.  Normal affect.  LUNGS:  There are rhonchi bilaterally.  CARDIOVASCULAR:  Heart, regular S1 and S2.  No significant murmur, 1+  posterior tibial pulses bilaterally.  There is an S4.  There is trace  ankle edema.  NECK:  JVP is about 7 cm of water.  No thyromegaly or thyroid nodule.  ABDOMEN:  Soft, obese, and nontender.  No hepatosplenomegaly.  Normal  bowel sounds.  SKIN:  Normal exam.  MUSCULOSKELETAL:  Normal exam.  HEENT:  Normal exam.   EKG shows normal sinus rhythm.  There is LVH with repolarization   ASSESSMENT AND PLAN:  This is a 60 year old history of cardiomyopathy,  atrial flutter, chronic kidney disease, and coronary artery disease.  He  returns for evaluation of congestive  heart failure and atrial  fibrillation.  1. Congestive heart failure.  The patient's congestive symptoms are      much improved.  He is able to walk on flat ground now without      shortness of breath.  He is in sinus rhythm today after his atrial      flutter ablation.  His ejection fraction has been severely       decreased on his most recent echocardiograms.  The ultimate      etiology of his cardiomyopathy is not clear.  He did have what      appeared to be single vessel coronary artery disease with severe      stenosis of the circumflex on a cath in July 2009.  However, this      did not explain the severity of his cardiomyopathy.  It may be that      he has tachycardia-mediated cardiomyopathy as it appears that he      had been in atrial flutter with a rapid response for a long period      of time.  He is now sinus rhythm, heart rate is 70.  Hopefully, we      will see some improvement in his LV function when we repeat his      echo in 1 month.  Additionally, I will plan on doing a left and      right heart catheterization in about 5 weeks, after he has      completed his month of Coumadin post atrial flutter ablation.      Today, he does appear much more euvolemic.  I will continue him on      irbesartan 300 mg daily, his hydralazine 100 mg 3 times a day and I      will increase his carvedilol CR to 80 mg daily, especially given      his continued hypertension.  I will decrease his Imdur to 60 mg      daily because of the headache with the increased dose and I will      continue him on his digoxin.  His Lasix will be continued at the      same dose, which is 40 mg twice a day and finally we will add      spironolactone 12.5 mg daily.  This may help control is resistant      blood pressure and also should be good for his cardiomyopathy.  2. Atrial flutter.  The patient is now in sinus rhythm, post flutter      ablation.  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  3. Coronary artery disease.  The patient had tight circumflex stenosis      in August 2009.  I do not have the details of any other arteries.      If this is all he had at that time, then it does not explain the      severity of his cardiomyopathy.  As I have mentioned, we will do a      left  heart catheterization in about 5 weeks' time, after he has      finished over a month of Coumadin, post atrial flutter ablation.      We will hold his Coumadin for few days to do the procedure.  4. Hyperlipidemia.  We will need to check the patient's lipids.  5. Chronic kidney disease.  Most recent creatinine was 1.1.  We will      get a Chem-7 in about 10 days after we have started his      spironolactone to make sure his creatinine and potassium stay      stable.  6. Smoking.  I congratulated the patient on cutting back.  I told him      to stop altogether.  7. The patient may be a candidate for an implantable cardioverter-      defibrillator, but we will need to see what his left ventricular      function looks like after he has been out of atrial flutter for      awhile.  If it indeed a tachycardia-mediated cardiomyopathy, it      should begin to improve.     Marca Ancona, MD  Electronically Signed    DM/MedQ  DD: 11/22/2008  DT: 11/23/2008  Job #: [id number]   cc:   Arta Silence, MD

## 2010-12-02 NOTE — Assessment & Plan Note (Signed)
[facility] OFFICE NOTE   NAME:HARRISKimmy, Parish                          MRN:          [id number]  DATE:10/25/2008                            DOB:          01/30/1952    PRIMARY CARE PHYSICIAN:  Arta Silence, MD   HISTORY OF PRESENT ILLNESS:  This is a 60 year old with history of  cardiomyopathy, coronary artery disease, atrial flutter, hypertension,  and chronic kidney disease who presents to Cardiology Clinic to  establish care.  This is a quite complicated patient.  I did review all  his old records today.  He has been seen in the past by Dr. Juliann Pares.  However, he has had to switch his care to our office because of  insurance issues.  The patient does have a history of about 2 years of  congestive heart failure.  His last echo was in December 2009 at  [facility] with an EF less than 25%, severely  dilated ventricle with severe left ventricular hypertrophy per report.  The patient does have a history of coronary artery disease.  He had a  left heart catheterization, it appears, done back in August 2009.  He  was described as having a 95% proximal circumflex stenosis.  No other  vessels were described, and this was managed medically.  Per the notes  from Dr. Juliann Pares, it appears that it was thought that his  cardiomyopathy was out of proportion to his coronary artery disease, so  the circumflex stenosis was managed medically.  The patient also has  been in and out of atrial flutter, it appears, over the last couple of  years.  He has been cardioverted in the past by Dr. Juliann Pares.  He has  been on sotalol in the past to try to maintain him in sinus rhythm.  He  was unable to afford that medication, so he has not been taking it.  He  was recently hospitalized from October 12, 2008 to October 15, 2008 at  [facility] with a CHF exacerbation and also  pneumonia.  He  apparently presented to the emergency department with  blood pressure of 221/125, with severe volume overload, and a  temperature of 101.  He was treated for a right lower lobe pneumonia.  He is now on levofloxacin.  He was also treated with IV Lasix.  He was  in atrial flutter during the hospitalization though it was not addressed  even though his EKGs show atrial flutter with primarily 2:1 block with  heart rate ranging in the 110s-120s.  He was discharged from the  hospital.  He was seen by Dr. Hetty Ely yesterday.  He was noted to have  a heart rate of 126 and atrial flutter with 2:1 block.  Dr. Hetty Ely did  increase his carvedilol yesterday to try to slow his heart rate down and  his heart rate is in the 90s today.  He does describe feeling somewhat  better with his breathing since he left the hospital.  However, he  thinks that over the  last few days, his breathing has become a little  worse again.  He is short of breath after walking less than half a  block.  He can walk around his house however, without being short of  breath.  He uses a Forensic scientist about twice a week but can only exercise  for about 4-5 minutes on it.  He denies paroxysmal nocturnal dyspnea.  However, he does have 2-3 pillow orthopnea.  He has not had any episodes  of chest pain recently and BNP was drawn at Dr. Lorenza Chick office and  was 560.  The patient does continue to smoke about 4-5 cigarettes a  week.   PAST MEDICAL HISTORY:  1. Congestive heart failure.  The patient's most recent echo that I      can find was done at [facility] in December      2009.  EF was described as less than 25%.  There is severe global      hypokinesis.  There is severe LV dilation.  There is severe LVH.      There was moderate mitral regurgitation.  There is moderate-to-      severe aortic insufficiency.  There is moderate-to-severe RV      dysfunction as well.  Per the notes, it was thought that this       cardiomyopathy is out of proportion due to the patient's degree of      the coronary disease.  The patient was never a heavy drinker in the      past.  He does have a history of hypertension and the severe LVH      could indicate a burned-out hypertensive cardiomyopathy, especially      the patient's blood pressure still appears to get significantly      high, it was 221/125 when he was in [facility] in March.  I am a bit      surprised that he is able to generate a blood pressure that high      with LV systolic function less than 25%.  2. Atrial flutter.  The patient was cardioverted back in 2008.  He has      been on Coumadin.  He is still on Coumadin.  He was in atrial      flutter in March and when he is in the hospital, and he is still      atrial flutter today, heart rate is still poorly controlled.  It      was 126 yesterday, it is in the high 90s today.  3. Coronary artery disease.  The patient had a left heart      catheterization in August 2009.  We cannot find the full details of      the heart cath.  The only vessel described was a 95% proximal      circumflex stenosis.  This was managed medically as Dr. Juliann Pares      did not think that this was the ultimate cause of his      cardiomyopathy.  Apparently, he did not have obstructive disease in      his other vessels.  4. Hypertension.  5. Hyperlipidemia.  6. Type 2 diabetes.  7. Chronic kidney disease.  Most recent creatinine is 1.6 in April      2010.  8. Active smoker.  The patient is down to about 4-5 cigarettes a week,      although in the past he has smoked half a pack a  day.   MEDICATIONS:  1. Warfarin.  2. Lasix 40 mg daily.  3. Hydralazine 50 mg q.i.d.  4. Aspirin 325 mg daily.  5. Digoxin 0.125 mg daily.  6. Irbesartan 150 mg daily.  7. Lipitor 10 mg daily.  8. Coreg 18.75 mg b.i.d.  9. Levofloxacin 750 mg daily.   SOCIAL HISTORY:  The patient is separated.  He has 2 children.  He was  laid off from Development worker, community job recently.  He used to smoke a half a  pack a day, however, he is down to about 4-5 cigarettes a week.  He  lives in [place].  He does not use any illicit drugs.  He is not  drinking alcohol now.  In the past, he drank some alcohol, but not  heavily, never used illicit drugs.   FAMILY HISTORY:  The patient's father had congestive heart failure of  uncertain etiology.  The patient's mother died at 81, he is unsure why.  The patient has a brother who has atrial fibrillation, congestive heart  failure, and also has an ICD.   REVIEW OF SYSTEMS:  All systems are reviewed and were negative except as  noted in the history of present illness.   EKG from October 24, 2008, was reviewed today, shows atrial flutter with  2:1 block at a rate of 126 with a left axis deviation.  QRS did not  appear wide.   LABORATORY DATA:  Most recent labs from April 2010; BNP 516, potassium  4.1, creatinine 1.6.  Digoxin level 0.7.  From June 2009, LDL is 117,  HDL is 30.6.   PHYSICAL EXAMINATION:  VITAL SIGNS:  Blood pressure is 142/80, pulse is  92 and irregular.  GENERAL:  This is a well-developed male in no apparent distress.  NEUROLOGIC:  Alert and orient x3.  Normal affect.  LUNGS:  There are bilateral rhonchi.  No crackles.  CARDIOVASCULAR:  Heart, irregular S1 and S2.  There is a 1/6 systolic  murmur at the left lower sternal border and the apex.  EXTREMITIES:  There is 1+ edema about half way up the lower legs  bilaterally.  There are 1+ posterior tibial pulses bilaterally.  There  is no carotid bruit.  I do not hear a diastolic murmur.  No clubbing or  cyanosis.  NECK:  JVP is 10-12 cm.  The thyroid appears to be possibly mildly  diffusely enlarged.  ABDOMEN:  Soft, obese, and nontender.  No hepatosplenomegaly.  Normal  bowel sounds.  SKIN:  Normal exam.  MUSCULOSKELETAL:  Normal exam.  HEENT:  Normal exam.   ASSESSMENT AND PLAN:  This is a 60 year old with a history of   cardiomyopathy, atrial flutter, chronic kidney disease, and coronary  artery disease who presents to Cardiology Clinic to establish care.  1. Congestive heart failure.  The patient has systolic CHF with a      cardiomyopathy of uncertain etiology.  EF was less than 25% per the      report from the [facility] echo in December      2009.  He was described as having severe global hypokinesis, severe      dilation of the LV, and severe left ventricular hypertrophy, as      well as RV dysfunction, moderate MR, moderate-to-severe aortic      insufficiency.  He is hypertensive today, and he did have a blood      pressure of 221/125 when he was at [facility] at the end of March.  I do find it hard to believe that with      an EF of less than 25, he is able to generate such a high blood      pressure.  One possible cause of his cardiomyopathy could be a      burned-out hypertensive cardiomyopathy given the severe LVH.      Another consideration would have to be cardiac amyloidosis, and      also the patient does have a history of coronary disease.      Apparently, the most significant stenosis being 95% proximal      circumflex, this was thought not to be the ultimate etiology of his      cardiomyopathy and it was managed medically back in August 2009.      The patient does have New York Heart Association class III      symptoms.  He is volume overloaded today and he is in atrial      flutter which is not helping things.  For workup of his      cardiomyopathy, I do want to repeat an echocardiogram to reassess      his valves and his function.  We will get a serum protein      electrophoresis.  We will check an HIV test.  I think eventually he      is going to need a right and left heart catheterization.  However,      we are not going to do this quite yet.  He does need optimization      of his volume status today.  I am going to increase his  Lasix to 40      mg twice a day.  His fast heart rate is not helping things.  I will      increase his Coreg to 25 mg twice a day today.  I am not sure how      compliant he is with taking hydralazine 4 times a day, so I am      going to simplify the regimen and make his hydralazine 75 mg 3      times a day.  We will also add Imdur 60 mg a day to his regimen as      well, and ultimately given the severity of Mr. Runnion'      cardiomyopathy, it may be that he is a candidate for heart      transplantation.  We will definitely need to do more medical      management and will need to try to get him out of atrial flutter      before making any decisions in that direction.  He will need a      right and left heart catheterization eventually.  2. Atrial flutter.  The patient is in atrial flutter today.  He was in      atrial flutter when he was in the hospital.  This seems to be      poorly controlled.  Heart rate is in the 90s today.  It was 126      yesterday and it was in the low 100s when he was in the hospital.      It does appear to be atrial flutter primarily with 2:1 block.  I do      think he would benefit from maintenance of sinus rhythm and I think  the best way to do this as the patient has failed cardioversions in      the past and has not been able afford to take any arrhythmic      medicines would be an atrial flutter ablation.  I am going to refer      him to our EP Service.  We will have them evaluate him for an      atrial flutter ablation.  He is on Coumadin now which he should      continue.  As I mentioned, I am going to increase his Coreg to 25      twice a day to try to rate control a bit.  He is also on digoxin.  3. Coronary artery disease.  The patient did have a quite severe      blockage in his circumflex in August 2009.  I do not have the      details listed in the cath report from [place] on the degree of      disease in the other vessels.  I agree that if this  is his only      major blockage, that coronary disease may not be the ultimate cause      of his cardiomyopathy.  I do think that this should be reassessed      especially in light of the severity of his cardiomyopathy.      Therefore, I will plan on a repeat left heart catheterization at      some point during his workup.  The patient should continue on      aspirin, but he can decrease it to 81 mg daily.  He should continue      on his Lipitor.  We will need to check his lipids.  He will need to      come back fasting for that.  He is on beta-blocker and an ARB.  4. Hyperlipidemia.  The patient's LDL was too high in June of last      year.  As I mentioned, we will need to have him back fasting to      check his cholesterol.  Goal LDL will be less than 70.  5. Chronic kidney disease.  The patient's most recent creatinine was      1.6.  We need to be careful with this.  He is on Irbesartan  150 mg      a day which we will continue.  6. Smoking.  I did encourage the patient strongly to stop smoking.  He      has a card for free Chantix which I encouraged him to get filled.  7. The patient, I believe, would be a candidate for an implantable      cardioverter-defibrillator.  I will have the EP discuss this with      them also when they see him for potential atrial flutter ablation.      He has had depressed LV systolic function now for about 2 years.      He does not have a wide QRS.     Marca Ancona, MD  Electronically Signed    DM/MedQ  DD: 10/25/2008  DT: 10/26/2008  Job #: [id number]   cc:   Arta Silence, MD

## 2010-12-02 NOTE — Assessment & Plan Note (Signed)
[facility] OFFICE NOTE   NAME:Bobby Gray, Canepa                          MRN:          [id number]  DATE:11/07/2008                            DOB:          01/30/1952    PRIMARY CARE PHYSICIAN:  Arta Silence, MD   HISTORY OF PRESENT ILLNESS:  This is a 60-year with history of systolic  congestive heart failure, coronary artery disease, atrial flutter,  hypertension and chronic kidney disease who returns to Cardiology Clinic  for evaluation of his congestive heart failure and his atrial flutter.  At last appointment, I did increase the patient's Lasix, rearranged his  antihypertensive regimen and brought him back for followup today.  Since  his last appointment, the patient thinks that his breathing has improved  somewhat on the increased Lasix.  He says he is less short of breath.  The edema that he had in his legs has gone down.  He does continue to be  short of breath; however, if he tries to walk up a flight of steps, and  he is also short of breath if he walks any more than about a city block.  However, this is an improvement compared to when we last saw him.  He  does not have any PND.  He continues to have 2-3 pillow orthopnea.  He  has had no chest pain.  The patient did see Dr. Sherryl Manges in  consultation for his atrial flutter.  He is set up for an atrial flutter  ablation on next Wednesday.  He will have at TEE prior to the procedure.   PAST MEDICAL HISTORY:  1. Congestive heart failure.  The patient's most recent echo was at      [facility] in December 2009.  EF was      described as less than 25%.  There was severe global hypokinesis.      There was severe LV dilation.  There was severe LVH.  There was      moderate mitral regurgitation.  There was moderate-to-severe aortic      insufficiency.  There was moderate-to-severe RV dysfunction as      well.  Per the notes from  [place] of the cardiomyopathy was out of      proportion to the patient's degree of coronary artery disease.  The      patient was never a heavy drinker in the past.  He does have a      history of hypertension that has been quite severe and his severe      LVH could indicate a burned out hypertensive cardiomyopathy,      especially given the fact, his blood pressure still appears quite      high.  We did check serum protein electrophoresis, which was      normal, a TSH, which is normal and an HIV, which was negative.  2. Atrial flutter.  The patient was cardioverted back in 2008.  He has      not been able to maintain sinus rhythm.  He  is in atrial flutter      still today and was in atrial flutter when I last saw him.  He was      started on sotalol; however, he could not afford the medication.  3. Coronary artery disease.  The patient has left heart      catheterization August 2009.  I cannot find the full details of      heart cath.  The only vessel described was a 95% proximal      circumflex stenosis.  This was managed medically by Dr. Juliann Pares      who was seeing him at that time, did not think that this was the      ultimate cause of his cardiomyopathy.  Apparently, he did not have      obstructive disease in is other vessels.  4. Hypertension.  5. Hyperlipidemia.  6. Type 2 diabetes.  7. Chronic kidney disease.  Most recent creatinine was 1.6 in April      2010.  8. Active smoker.  The patient is down to smoking 3 cigarettes a day      now.  In the past he smoked half-pack a day.   MEDICATIONS:  1. Warfarin.  2. Digoxin 0.125 mg daily.  3. Irbesartan 150 mg daily.  4. Lipitor 10 mg daily.  5. Coreg 25 mg b.i.d.  6. Hydralazine 75 mg t.i.d.  7. Imdur 60 mg daily.  8. Lasix 40 mg b.i.d.  9. Aspirin 81 mg daily.   SOCIAL HISTORY:  The patient is separated.  He has 2 children.  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  He used smoke half pack a  day, now is  down to about 3 cigarettes a day.  He lives in [place].  He uses no illicit drugs.  He is not drinking now.  In the past he  drinks some alcohol, but never really heavily and did not use illicit  drugs.   FAMILY HISTORY:  The patient's father had congestive heart failure of  uncertain etiology.  His mother died at 80.  He is unsure why.  He has a  brother who has atrial fibrillation, congestive heart failure, and an  ICD   REVIEW OF SYSTEMS:  All systems reviewed were negative except as noted  in history of present illness.   LABORATORY DATA:  Review of most recent labs from April 2010.  SPEP  negative.  TSH normal.  HIV test negative   PHYSICAL EXAMINATION:  VITAL SIGNS:  Blood pressure is 152/92, heart  rate is 124 initially and decreased to about 110 when he was at rest.  This is irregular.  GENERAL:  This is a well-developed male, in no apparent distress.  NEUROLOGIC:  Alert and oriented x3.  Normal affect.  LUNGS:  There are slight crackles at bases bilaterally.  CARDIOVASCULAR:  Heart irregular S1 and S2.  There is a 2/6 systolic  murmur at the left lower sternal border and at the apex.  I do not hear  diastolic murmur.  There is no peripheral edema.  There are 1+ posterior  tibial pulses bilaterally.  NECK:  JVP is elevated to about 10-12 cm of water.  No thyromegaly or  thyroid nodule.  ABDOMEN:  Soft, obese, and nontender.  No hepatosplenomegaly.  Normal  bowel sounds.  SKIN:  Normal exam.  MUSCULOSKELETAL:  Normal exam.  HEENT:  Normal exam.   ASSESSMENT/PLAN:  This is a 60 year old with history of  cardiomyopathy,  atrial flutter, chronic kidney disease, coronary artery disease who  returns for evaluation of congestive heart failure and atrial  fibrillation.  1. Congestive heart failure.  The patient does continue to have [place]      [place] Heart Association class III symptoms.  His last echocardiogram      showed an ejection fraction of less than 25% with severe  left      ventricular hypertrophy, moderate mitral regurgitation and moderate-      to-severe aortic insufficiency.  He does continue to be      hypertensive.  The ultimate etiology of his cardiomyopathy is not      clear.  He does have what appears to have been single vessel      coronary disease with severe stenosis of his circumflex on a      catheterization in July 2009; however this does not explain his      severe cardiomyopathy.  It may be that he has a component of      tachycardia mediated cardiomyopathy given that he appears to be in      atrial flutter with rapid response at all times.  His HIV, his SPEP      and his TSH were all unremarkable.  He is not drinking any alcohol      or using any illicit drugs.  Another possibility could be a burned      out hypertensive cardiomyopathy, as the patient has had high blood      pressure for long time.  Today, the patient is still somewhat      volume overloaded.  However, his symptoms and his volume status are      improved compared to the last time that I saw him. We will continue      him for now on Lasix 40 mg twice a day.  We will try to improve his      blood pressure control.  I am going to titrate up his irbesartan to      300 mg daily.  I am going to increase his hydralazine to 100 mg 3      times a day and  Imdur to 90 mg daily.  Next week he will get a      Chem-7 to assess his creatinine and potassium with these changes.      He will continue on his Coreg 25 twice a day and his digoxin.      After the patient has had his atrial flutter ablation we will      probably wait 4-6 weeks after the procedure and then go forward      with a right and left heart catheterization when we can safely hold      his Coumadin for a few days.  I would like to see the degree of      coronary artery disease at this time to see if coronary      intervention would be feasible or helpful.  I also want to assess      his filling pressures and his  cardiac index after he has been out      of atrial flutter for about a month or so.  2. Atrial flutter.  This may be a contributor to the patient's      cardiomyopathy, as he has very poorly controlled atrial flutter and      appears to have had this for long period of time.  He is  set up for      an atrial flutter ablation next week.  This could potentially allow      him to get off Coumadin in the future.  For now, we will just      continue him on his Coreg 25 mg twice a day and his digoxin to      attempt to obtain some degree of rate control.  He is on warfarin.      He will have a TEE prior to the procedure  3. Coronary artery disease.  The patient had a tight circumflex      stenosis in August 2009.  I do not have any details of any other      coronary artery disease.  If this is all he had then this does not      explain the severity of his cardiomyopathy.  As mentioned, we will      go back in for a left heart catheterization about a month or 2      after his atrial flutter ablation is done, at a time point, at      which we can hold his Coumadin for few days.  For now, we will      continue on his aspirin, statin, and his beta-blocker, and ARB.  4. Hyperlipidemia.  I will check the patient's lipids when he comes in      for labs next week.  5. Chronic kidney disease.  Most recent creatinine was 1.6.  We will      check his creatinine on the increased irbesartan.  6. Smoking.  I once again encouraged the patient to stop smoking.  The      patient was given some Chantix, which he will try to get filled at      [facility].  7. The patient may be a candidate for an implantable cardioverter-      defibrillator.  We will see what his LV function looks like after      his flutter ablation.     Marca Ancona, MD  Electronically Signed    DM/MedQ  DD: 11/07/2008  DT: 11/08/2008  Job #: [id number]   cc:   Arta Silence, MD

## 2010-12-02 NOTE — Discharge Summary (Signed)
NAME:  Bobby Gray, Bobby Gray NO.:  [id number]   MEDICAL RECORD NO.:  [id number]          PATIENT TYPE:  INP   LOCATION:  3735                         FACILITY:  [facility]   PHYSICIAN:  Duke Salvia, MD, FACCDATE OF BIRTH:  01/30/1952   DATE OF ADMISSION:  11/14/2008  DATE OF DISCHARGE:  11/16/2008                               DISCHARGE SUMMARY   PRIMARY CARDIOLOGIST:  Marca Ancona, MD.   ELECTROPHYSIOLOGIST:  Duke Salvia, MD, [facility]   PRIMARY CARE PHYSICIAN:  Arta Silence, MD   DISCHARGE DIAGNOSES:  1. Atrial flutter with rapid ventricular response.  2. Ischemic and nonischemic cardiomyopathy, (tachy-mediated versus      burnout hypertension).  3. Mild aortic regurgitation, mild to moderate.  4. Tricuspid regurgitation, moderate.  5. Right ventricular systolic function severely reduced.   SECONDARY DIAGNOSES:  1. 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  2. Atrial flutter (status post ablation, see procedure section).  3. Coronary artery disease (last cath in August 2009 with 95% proximal      stenosis of the circumflex).  4. Hypertension.  5. Hyperlipidemia.  6. Type 2 diabetes mellitus.  7. Chronic kidney disease (stage I for creatinine on this admission).  8. Ongoing tobacco abuse disorder.   ALLERGIES:  NKDA.   PROCEDURE:  Transesophageal echocardiogram performed on November 14, 2008,  showing,  1. Left ventricle; cavity size was dilated, systolic function severely      reduced, ejection fraction estimated at 15%, severe diffuse      hypokinesis.  2. Aortic valve; no evidence of vegetation, mild-to-moderate regurg.  3. Mitral valve; no evidence of vegetation.  4. Left atrium:  Atrium was dilated, no evidence of thrombus in      appendage.  5. Right ventricle:  Systolic function severely reduced.  6. Right atrium:  Dilated.  7. Atrial septum:  No defect or patent foramen  ovale.  8. Tricuspid valve:  Moderate regurgitation.  9. Invasive electrophysiological study, substrate mapping, and      radiofrequency catheter ablation.  This was done on November 14, 2008.   SUMMARY:  Mapping confirmed cavotricuspid isthmus dependence of atrial  flutter.  Radiofrequency energy applied across the isthmus, successfully  interrupted conduction, terminating the flutter and eliminating with  subsequent RF cavotricuspid isthmus conduction.  The S1-A2 interval was  approximately was 205 ms following ablation.   EKG performed on November 15, 2008, showing normal sinus rhythm at a rate  of 86 bpm and then possible left atrial enlargement, left ventricular  hypertrophy, T-wave inversion in V5 and V6, as well as T-wave flattening  in I and aVL.  No significant Q waves.  Left axis deviation, PR 178, QRS  102, and QTc 433.  EKG performed on November 15, 2008, showing normal sinus  rhythm at a rate of 71 bpm.  No significant changes from prior tracing.   BRIEF HISTORY OF PRESENT ILLNESS:  Mr. Condie is a 60 year old male with  a history of CHF, CAD, atrial flutter, hypertension, and  chronic kidney  disease, seen in the clinic on November 07, 2008, for eval of CHF and  atrial flutter.  The patient had generally improved from his last visit  on October 25, 2008, however, he still continued to be symptomatic with  shortness of breath, lower extremity edema, orthopnea, and dyspnea on  exertion.  The patient had already been scheduled for atrial flutter  ablation and continued planning at the time of his office visit included  a TEE to be completed prior to procedure.  The patient was admitted per  plans on November 14, 2008.   HOSPITAL COURSE:  The patient was admitted and underwent procedures as  described above.  He tolerated them well without significant  complications.  The patient had some slight medication adjustment, see  medication section.  The patient was deemed stable for discharge on   November 16, 2008.  Vital signs remained during brief hospital course.   Most recent vital signs on date of discharge, temperature was 98.0  degrees Fahrenheit, blood pressure 132/94, pulse 82, respirations 20, O2  saturation 92% on room air.  The patient will follow up with Coumadin  Clinic in [place] on May 3, and with Dr. Shirlee Latch for followup office  visit on May 6.  The patient will receive his new medication list along  with his prescriptions and followup instructions at the time of his  discharge.  All questions and concerns should be addressed at that time.   DISCHARGE LABORATORY DATA:  WBC 5.1, HGB 13.1, HCT 38.3, PLT count 164.  Protime 25.8, INR 2.2.  On admission, sodium 142, potassium 3.7,  chloride 108, CO2 25, BUN 9, creatinine 1.14, glucose 96, and calcium  8.9.   FOLLOWUP PLANS AND APPOINTMENTS:  Please see hospital course.   DISCHARGE MEDICATIONS:  1. Lasix 40 mg p.o. b.i.d.  2. Coreg 25 mg p.o. b.i.d.  3. Irbesartan 300 mg p.o. daily.  4. Hydralazine 100 mg p.o. t.i.d.  5. Lipitor 10 mg p.o. daily.  6. Aspirin 81 mg p.o. daily.  7. Warfarin 10 mg on Tuesday, Thursday, Saturday, and Sunday, and 12.5      mg on Monday, Wednesday, and Friday.  8. Imdur 90 mg p.o. daily.  9. Digoxin 0.125 mg p.o. daily.  10.Nitroglycerin 0.4 mg sublingual p.r.n. for chest pain.   Duration of the discharge encounter including physician time, was 45  minutes.      Jarrett Ables, [facility]      Duke Salvia, MD, [facility]  Electronically Signed    MS/MEDQ  D:  11/16/2008  T:  11/17/2008  Job:  [id number]   cc:   Marca Ancona, MD  Duke Salvia, MD, [facility]  Arta Silence, MD

## 2011-01-01 ENCOUNTER — Encounter: Payer: Self-pay | Admitting: Internal Medicine

## 2011-01-09 ENCOUNTER — Ambulatory Visit (INDEPENDENT_AMBULATORY_CARE_PROVIDER_SITE_OTHER): Payer: Medicare Other | Admitting: Cardiology

## 2011-01-09 ENCOUNTER — Encounter: Payer: Self-pay | Admitting: Cardiology

## 2011-01-09 ENCOUNTER — Other Ambulatory Visit: Payer: Self-pay | Admitting: Cardiology

## 2011-01-09 DIAGNOSIS — E785 Hyperlipidemia, unspecified: Secondary | ICD-10-CM

## 2011-01-09 DIAGNOSIS — I509 Heart failure, unspecified: Secondary | ICD-10-CM

## 2011-01-09 DIAGNOSIS — I719 Aortic aneurysm of unspecified site, without rupture: Secondary | ICD-10-CM

## 2011-01-09 DIAGNOSIS — F17200 Nicotine dependence, unspecified, uncomplicated: Secondary | ICD-10-CM

## 2011-01-09 DIAGNOSIS — I5032 Chronic diastolic (congestive) heart failure: Secondary | ICD-10-CM

## 2011-01-09 DIAGNOSIS — I428 Other cardiomyopathies: Secondary | ICD-10-CM

## 2011-01-09 DIAGNOSIS — I10 Essential (primary) hypertension: Secondary | ICD-10-CM

## 2011-01-09 DIAGNOSIS — I4892 Unspecified atrial flutter: Secondary | ICD-10-CM

## 2011-01-09 LAB — HEPATIC FUNCTION PANEL
AST: 10 U/L (ref 0–37)
Bilirubin, Direct: 0.1 mg/dL (ref 0.0–0.3)
Total Bilirubin: 0.4 mg/dL (ref 0.3–1.2)

## 2011-01-09 LAB — BASIC METABOLIC PANEL
BUN: 10 mg/dL (ref 6–23)
CO2: 25 mEq/L (ref 19–32)
Chloride: 104 mEq/L (ref 96–112)
Creat: 1.22 mg/dL (ref 0.50–1.35)
Glucose, Bld: 89 mg/dL (ref 70–99)

## 2011-01-09 LAB — TSH: TSH: 1.174 u[IU]/mL (ref 0.350–4.500)

## 2011-01-09 LAB — LIPID PANEL: Total CHOL/HDL Ratio: 5.8 Ratio

## 2011-01-09 MED ORDER — AMLODIPINE BESYLATE 10 MG PO TABS: 10.0000 mg | ORAL_TABLET | Freq: Every day | ORAL | Status: DC

## 2011-01-09 NOTE — Patient Instructions (Addendum)
Your physician has recommended you make the following change in your medication: STOP Digoxin. INCREASE Amlodipine to 10mg  daily.  Monitor your BP for 2 weeks and call our office with your readings.  SCHEDULED 01/26/11 AT 9:00AM. PLEASE ARRIVE AT 8:45 AM AT THE RADIOLOGY DEPT AT [facility]. THERE IS NO PREP FOR THIS PROCEDURE:  Your physician has requested that you have a cardiac MRI and Chest MRA. Cardiac MRI uses a computer to create images of your heart as its beating, producing both still and moving pictures of your heart and major blood vessels. For further information please visit [email or web address]. Please follow the instruction sheet given to you today for more information.  We will call you with your lab results drawn today.  Your physician recommends that you schedule a follow-up appointment in: 6 months   .

## 2011-01-10 NOTE — Progress Notes (Signed)
PCP: Dr. 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  I last saw him back in 12/10.  Patient had an echo in 12/11 showing EF 55-60% with mild LVH. LHC in 6/10 showed no obstructive CAD.  He has been noted by echo and cath to have mild aortic insufficiency and a mild ascending aortic aneurysm.    He has been doing well symptomatically since his atrial flutter ablation.  He walks about 1 mile 3-4 times a week.  Other days he walks on a treadmill.  No dyspnea walking on flat ground.  Some shortness of breath going up a flight of steps.  No chest pain.  Weight has been stable.  Blood pressure has been high, with systolic BP running up to the 811B.  He is now on disability.   ECG:  NSR, borderline criteria for old septal infarct.  Labs (8/10): LDL 95, K 4.5, creatinine 1.47, BNP 17  Allergies (verified):  No Known Drug Allergies  Past Medical History: 1. Congestive heart failure.  TTE [facility] 12/09 with EF less than 25%, severe global hypokinesis, severe LV dilation with severe LVH.  There was moderate MR, moderate-to-severe aortic insufficiency, moderate-to-severe RV dysfunction. TEE 4/10 at [facility] corroborated the decreased EF, with estimated EF of 15%; however, aortic insufficiency was only mild-to- moderate and there was only trivial mitral regurgitation.  We did check a serum protein electrophoresis, which was normal.  His TSH was normal and HIV was negative.  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  After atrial flutter ablation, echo (6/10) showed EF 55-65% with moderate LV hypertrophy and possible moderate aortic insufficiency.  LV-gram (6/10) showed EF 55% with mild to moderate aortic insufficiency on aortic root shot.  Echo (12/11): EF 55-65%, mild LV hypertrophy, grade I diastolic dysfunction, mild aortic insufficiency, aortic  root 4.3 cm.  2. Atrial flutter.  The patient was in atrial flutter with poorly controlled rate, apparently for a long period of time.  He did have a flutter ablation April 2010 and is in sinus rhythm today.  EF corrected after ablation. 3. Coronary artery disease.  The patient had LHC 4/09 at [facility] The only disease described was 95% proximal circumflex stenosis.  He was managed medically by Dr. Juliann Pares who was seeing him at that time and did not think that CAD was the ultimate cause of his cardiomyopathy.  We repeated LHC (6/10) showing nonobstructive CAD only.  There was no proximal CFX stenosis.   4. Mild to moderate aortic insufficiency: possibly due to aortic root dilatation.  Most recent echo (12/11) with mild AI.  Trileaflet aortic valve.  5. Ascending aortic aneurysm: 4.3 cm on cath in 6/10.  4.3 cm aortic root by echo in 12/11.  6. Hypertension. 7. Hyperlipidemia. 8. Type 2 diabetes. 9. Chronic kidney disease, most recent creatinine was 1.27 in 8/10 10. Active smoker.  The patient is down to smoking 1 pack/month.  Family History: Father:dec 62  Htn CHF Mother:dec  41 ?WHY  Brother A Afib, Cardiomegaly Has Defibrillator Brother A Brother A Brother A Brother A Brother dec 56 Kidney failure, declined dialysis CV: NEG. HBP: + SELF DM: NEGATIVE BREAST/OVARIAN/UTERINE CANCER: NEG. DEPRESSION: NEG. ETOH/DRUG ABUSE: NEG.   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  Etoh yes Drug use-no  Review of Systems        All systems reviewed and negative except per HPI.   Current Outpatient Prescriptions  Medication Sig Dispense Refill  . amLODipine (NORVASC) 10 MG tablet Take 1 tablet (10 mg total) by mouth daily.  30 tablet  6  . aspirin 81 MG EC tablet Take 81 mg by mouth daily.        . Blood Glucose Monitoring Suppl  (ULTIMA) KIT daily.        . carvedilol (COREG CR) 40 MG 24 hr capsule Take 40 mg by mouth 2 (two) times daily.        . furosemide (LASIX) 40 MG tablet Take 40 mg by mouth daily.       . hydrALAZINE (APRESOLINE) 50 MG tablet Take 50 mg by mouth 3 (three) times daily.       . nitroGLYCERIN (NITROSTAT) 0.4 MG SL tablet Place 0.4 mg under the tongue every 5 (five) minutes as needed.        . terbinafine (LAMISIL) 250 MG tablet Take 250 mg by mouth daily.        . valsartan (DIOVAN) 320 MG tablet Take 320 mg by mouth daily.        . rosuvastatin (CRESTOR) 10 MG tablet Take 10 mg by mouth daily.        Marland Kitchen spironolactone (ALDACTONE) 25 MG tablet Take 12.5 mg by mouth daily.          BP 137/82  Pulse 68  Ht 5\' 9"  (1.753 m)  Wt 237 lb 12.8 oz (107.865 kg)  BMI 35.12 kg/m2 General: NAD Neck: JVP 8 cm, no thyromegaly or thyroid nodule.  Lungs: Rhonchi bilaterally CV: Nondisplaced PMI.  Heart regular S1/S2, no S3/S4, no murmur.  No peripheral edema.  No carotid bruit.  Normal pedal pulses.  Abdomen: Soft, nontender, no hepatosplenomegaly, no distention.  Neurologic: Alert and oriented x 3.  Psych: Normal affect. Extremities: No clubbing or cyanosis.

## 2011-01-11 NOTE — Assessment & Plan Note (Signed)
Check lipids/LFTs on Crestor.

## 2011-01-11 NOTE — Assessment & Plan Note (Signed)
I counselled him to quit smoking entirely.  He has cut back.

## 2011-01-11 NOTE — Assessment & Plan Note (Signed)
Ascending aortic aneurysm seen on echo.  Thoracic aorta not completely visualized on transthoracic echo.  I will arrange for MR angiogram of the chest to fully assess the thoracic aorta.

## 2011-01-11 NOTE — Assessment & Plan Note (Signed)
BP runs quite high at home.  Increase amlodipine to 10 mg daily with BP check in 2 wks.

## 2011-01-11 NOTE — Assessment & Plan Note (Signed)
No known recurrence since flutter ablation.

## 2011-01-11 NOTE — Assessment & Plan Note (Signed)
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  With flutter ablation, EF corrected to normal.  He still has a degree of diastolic CHF likely due to long-standing HTN.  Symptoms are NYHA class II.  He is minimally volume overloaded.  Continue current dose of Lasix (40 mg daily).  He can stop digoxin.  Check BNP and BMET.

## 2011-01-12 ENCOUNTER — Encounter: Payer: Self-pay | Admitting: *Deleted

## 2011-01-26 ENCOUNTER — Ambulatory Visit (HOSPITAL_COMMUNITY)
Admission: RE | Admit: 2011-01-26 | Discharge: 2011-01-26 | Disposition: A | Discharge status: Home / Self Care | Payer: Medicare Other | Source: Ambulatory Visit | Attending: Cardiology | Admitting: Cardiology

## 2011-01-26 ENCOUNTER — Other Ambulatory Visit: Payer: Self-pay | Admitting: Cardiology

## 2011-01-26 ENCOUNTER — Inpatient Hospital Stay (HOSPITAL_COMMUNITY): Admission: RE | Admit: 2011-01-26 | Payer: Medicare Other | Source: Ambulatory Visit

## 2011-01-26 DIAGNOSIS — I719 Aortic aneurysm of unspecified site, without rupture: Secondary | ICD-10-CM

## 2011-01-26 DIAGNOSIS — I77819 Aortic ectasia, unspecified site: Secondary | ICD-10-CM | POA: Insufficient documentation

## 2011-01-26 MED ORDER — GADOBENATE DIMEGLUMINE 529 MG/ML IV SOLN
20.0000 mL | Freq: Once | INTRAVENOUS | Status: AC
  Administered 2011-01-26: 20 mL via INTRAVENOUS

## 2011-01-27 ENCOUNTER — Other Ambulatory Visit: Payer: Self-pay | Admitting: Cardiology

## 2011-01-27 DIAGNOSIS — I719 Aortic aneurysm of unspecified site, without rupture: Secondary | ICD-10-CM

## 2011-01-27 DIAGNOSIS — I359 Nonrheumatic aortic valve disorder, unspecified: Secondary | ICD-10-CM

## 2011-02-11 ENCOUNTER — Other Ambulatory Visit: Payer: Self-pay | Admitting: Cardiology

## 2011-02-12 ENCOUNTER — Telehealth: Payer: Self-pay

## 2011-02-12 MED ORDER — FUROSEMIDE 40 MG PO TABS: 40.0000 mg | ORAL_TABLET | Freq: Every day | ORAL | Status: DC

## 2011-02-12 NOTE — Telephone Encounter (Signed)
Refill requested for furosemide.

## 2011-04-15 ENCOUNTER — Other Ambulatory Visit: Payer: Self-pay | Admitting: Cardiology

## 2011-07-15 ENCOUNTER — Encounter: Payer: Self-pay | Admitting: Cardiology

## 2011-07-16 ENCOUNTER — Ambulatory Visit: Payer: Medicare Other | Admitting: Cardiology

## 2011-08-04 ENCOUNTER — Ambulatory Visit: Payer: Medicare Other | Admitting: Cardiology

## 2011-09-02 ENCOUNTER — Ambulatory Visit (INDEPENDENT_AMBULATORY_CARE_PROVIDER_SITE_OTHER): Payer: Medicare Other | Admitting: Cardiology

## 2011-09-02 ENCOUNTER — Encounter: Payer: Self-pay | Admitting: Cardiology

## 2011-09-02 VITALS — BP 151/82 | HR 80 | Ht 69.0 in | Wt 248.0 lb

## 2011-09-02 DIAGNOSIS — I4892 Unspecified atrial flutter: Secondary | ICD-10-CM

## 2011-09-02 DIAGNOSIS — F17200 Nicotine dependence, unspecified, uncomplicated: Secondary | ICD-10-CM

## 2011-09-02 DIAGNOSIS — I719 Aortic aneurysm of unspecified site, without rupture: Secondary | ICD-10-CM

## 2011-09-02 DIAGNOSIS — R0602 Shortness of breath: Secondary | ICD-10-CM

## 2011-09-02 DIAGNOSIS — I10 Essential (primary) hypertension: Secondary | ICD-10-CM

## 2011-09-02 DIAGNOSIS — E785 Hyperlipidemia, unspecified: Secondary | ICD-10-CM

## 2011-09-02 MED ORDER — SPIRONOLACTONE 25 MG PO TABS: 25.0000 mg | ORAL_TABLET | Freq: Every day | ORAL | Status: DC

## 2011-09-02 MED ORDER — TIOTROPIUM BROMIDE MONOHYDRATE 18 MCG IN CAPS: 18.0000 ug | ORAL_CAPSULE | Freq: Every day | RESPIRATORY_TRACT | Status: DC

## 2011-09-02 NOTE — Patient Instructions (Addendum)
Need to have an MRA Angiogram @ [facility]. We will call you with the information regarding the MRA Angiogram. Need to have a BMET/BNP/LIPIDS in two weeks.  Need to start on Spiriva daily. Start on Spironolactone 12.5 mg take one tablet daily.

## 2011-09-06 NOTE — Assessment & Plan Note (Signed)
BP still high, needs better control.  I will add spironolactone 12.5 mg daily given resistant HTN.  BMET in 2 wks.  Will call him for BP check in 2 wks. (he will check daily and record).

## 2011-09-06 NOTE — Progress Notes (Signed)
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  I last saw him back in 12/10.  Patient had an echo in 12/11 showing EF 55-60% with mild LVH. LHC in 6/10 showed no obstructive CAD.  He has been noted by echo and cath to have mild aortic insufficiency and a moderate aortic root aneurysm.    Blood pressure is still high, 150/82 today and high when he checks it at home.  He has been short of breath after walking about 100 yards.  He wheezes occasionally. He continues to smoke about 8 cigarettes a week.    ECG:  NSR, poor anterior R wave progression.   Labs (8/10): LDL 95, K 4.5, creatinine 1.61, BNP 17 Labs (6/12): BNP 15, creatinine 1.2  Allergies (verified):  No Known Drug Allergies  Past Medical History: 1. Congestive heart failure.  TTE [facility] 12/09 with EF less than 25%, severe global hypokinesis, severe LV dilation with severe LVH.  There was moderate MR, moderate-to-severe aortic insufficiency, moderate-to-severe RV dysfunction. TEE 4/10 at [facility] corroborated the decreased EF, with estimated EF of 15%; however, aortic insufficiency was only mild-to- moderate and there was only trivial mitral regurgitation.  We did check a serum protein electrophoresis, which was normal.  His TSH was normal and HIV was negative.  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  After atrial flutter ablation, echo (6/10) showed EF 55-65% with moderate LV hypertrophy and possible moderate aortic insufficiency.  LV-gram (6/10) showed EF 55% with mild to moderate aortic insufficiency on aortic root shot.  Echo (12/11): EF 55-65%, mild LV hypertrophy, grade I diastolic dysfunction, mild aortic insufficiency, aortic root 4.3 cm.  Cardiac MRI/MRA (6/12) with EF 67%, mild AI, moderate LVH, 4.6 cm aortic root.  2. Atrial flutter.  The patient was in atrial  flutter with poorly controlled rate, apparently for a long period of time.  He did have a flutter ablation April 2010 and is in sinus rhythm today.  EF corrected after ablation. 3. Coronary artery disease.  The patient had LHC 4/09 at [facility]. The only disease described was 95% proximal circumflex stenosis.  He was managed medically by Dr. Juliann Pares who was seeing him at that time and did not think that CAD was the ultimate cause of his cardiomyopathy.  We repeated LHC (6/10) showing nonobstructive CAD only.  There was no proximal CFX stenosis.   4. Mild to moderate aortic insufficiency: possibly due to aortic root dilatation.  Most recent echo (12/11) with mild AI.  Trileaflet aortic valve.  5. Ascending aortic aneurysm: 4.3 cm on cath in 6/10.  4.3 cm aortic root by echo in 12/11.  4.6 cm aortic root by cardiac MRI/MRA in 6/12 (MRI also showed moderate LVH, EF 67%).  6. Hypertension. 7. Hyperlipidemia. 8. Type 2 diabetes. 9. Chronic kidney disease, most recent creatinine was 1.27 in 8/10 10. COPD.  Active smoker.  The patient is down to smoking 1 pack/month.  Family History: Father:dec 62  Htn CHF Mother:dec  41 ?WHY  Brother A Afib, Cardiomegaly Has Defibrillator Brother A Brother A Brother A Brother A Brother dec 56 Kidney failure, declined dialysis CV: NEG. HBP: + SELF DM: NEGATIVE BREAST/OVARIAN/UTERINE CANCER: NEG. DEPRESSION: NEG. ETOH/DRUG ABUSE: NEG.   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  Etoh yes Drug use-no  Review of Systems        All systems reviewed and negative except per HPI.   Current Outpatient Prescriptions  Medication Sig Dispense Refill  . amLODipine (NORVASC) 10 MG tablet Take 1 tablet (10 mg total) by mouth daily.  30 tablet  6  . aspirin 81 MG EC tablet Take 81 mg by mouth daily.        .  Blood Glucose Monitoring Suppl (ULTIMA) KIT daily.        . carvedilol (COREG CR) 40 MG 24 hr capsule Take 40 mg by mouth 2 (two) times daily.        . furosemide (LASIX) 40 MG tablet Take 1 tablet (40 mg total) by mouth daily.  30 tablet  6  . hydrALAZINE (APRESOLINE) 50 MG tablet TAKE TWO TABLETS BY MOUTH THREE TIMES DAILY  180 tablet  3  . nitroGLYCERIN (NITROSTAT) 0.4 MG SL tablet Place 0.4 mg under the tongue every 5 (five) minutes as needed.        . rosuvastatin (CRESTOR) 10 MG tablet Take 10 mg by mouth daily.        Marland Kitchen terbinafine (LAMISIL) 250 MG tablet Take 250 mg by mouth daily.        . valsartan (DIOVAN) 320 MG tablet Take 320 mg by mouth daily.        Marland Kitchen spironolactone (ALDACTONE) 25 MG tablet Take 1/2 tablet daily.      Marland Kitchen tiotropium (SPIRIVA HANDIHALER) 18 MCG inhalation capsule Place 1 capsule (18 mcg total) into inhaler and inhale daily.  30 capsule  1    BP 151/82  Pulse 80  Ht 5\' 9"  (1.753 m)  Wt 112.492 kg (248 lb)  BMI 36.62 kg/m2 General: NAD, obese Neck: JVP 7 cm, no thyromegaly or thyroid nodule.  Lungs: Decreased breath sounds bilaterally.  CV: Nondisplaced PMI.  Heart regular S1/S2, no S3/S4, no murmur.  No peripheral edema.  No carotid bruit.  Normal pedal pulses.  Abdomen: Soft, nontender, no hepatosplenomegaly, no distention.  Neurologic: Alert and oriented x 3.  Psych: Normal affect. Extremities: No clubbing or cyanosis.

## 2011-09-06 NOTE — Assessment & Plan Note (Signed)
Moderate aortic root aneurysm on MRA chest in 6/12.  Will repeat to assess for progression in 6/13.  Need to control blood pressure.

## 2011-09-06 NOTE — Assessment & Plan Note (Signed)
I strongly encouraged him to stop smoking.  I suspect that he has significant COPD.

## 2011-09-06 NOTE — Assessment & Plan Note (Signed)
Exertional dyspnea.  I suspect that COPD may be playing a role.  He is on Advair.  He needs to stop smoking. I will have him try Spiriva to see if it helps.  I will also check a BNP today.  EF was normal on cardiac MRI in 6/12.  He does not appear volume overloaded on exam.

## 2011-09-06 NOTE — Assessment & Plan Note (Signed)
Check lipids/LFTs on Crestor.  

## 2011-09-06 NOTE — Assessment & Plan Note (Signed)
No known recurrence since flutter ablation.  

## 2011-09-17 ENCOUNTER — Ambulatory Visit (HOSPITAL_COMMUNITY)
Admission: RE | Admit: 2011-09-17 | Discharge: 2011-09-17 | Disposition: A | Discharge status: Home / Self Care | Payer: Medicare Other | Source: Ambulatory Visit | Attending: Cardiology | Admitting: Cardiology

## 2011-09-17 ENCOUNTER — Other Ambulatory Visit: Payer: Medicare Other

## 2011-09-17 DIAGNOSIS — I719 Aortic aneurysm of unspecified site, without rupture: Secondary | ICD-10-CM | POA: Insufficient documentation

## 2011-09-17 LAB — BASIC METABOLIC PANEL
CO2: 26 mEq/L (ref 19–32)
Calcium: 9.5 mg/dL (ref 8.4–10.5)
GFR calc Af Amer: 79 mL/min — ABNORMAL LOW (ref >90)
GFR calc non Af Amer: 68 mL/min — ABNORMAL LOW (ref >90)
Sodium: 139 mEq/L (ref 135–145)

## 2011-09-17 LAB — LIPID PANEL
Cholesterol: 164 mg/dL (ref 0–200)
Triglycerides: 145 mg/dL (ref <150)
VLDL: 29 mg/dL (ref 0–40)

## 2011-09-17 MED ORDER — GADOBENATE DIMEGLUMINE 529 MG/ML IV SOLN
20.0000 mL | Freq: Once | INTRAVENOUS | Status: AC | PRN
  Administered 2011-09-17: 20 mL via INTRAVENOUS

## 2011-09-17 NOTE — Discharge Instructions (Signed)
Abdominal Aortic Aneurysm, Endograft Repair The aorta is a large blood vessel that carries blood from the heart to the rest of the body. An abdominal aortic aneurysm (AAA) is a weakness in the wall of the aorta and can cause the aorta to balloon or bulge out. If the aneurysm becomes too big, it can burst and be fatal.  An endograft is a special aortic stent graft used to repair the AAA. It is a tube made up of fabric, which is supported by a metal mesh (stent). It prevents the aneurysm from rupturing. The endograft creates a new track for the blood to flow inside the aorta, diverting it from the aneurysm. The stent is permanent. SYMPTOMS   May be absent.   Pain in the abdomen, chest or back. This may be a "throbbing," "aching" or "tearing" feeling.   A lump or mass in the abdomen that pulses.   Cold foot or blue toes.   Severe pain and collapse if the aneurysm bursts.  RISK FACTORS  Family history of AAA.   Smoking.   High blood pressure.   High levels of fat (cholesterol) in the blood that cling to the wall of the aorta.   "Hardening of the arteries" (arteriosclerosis).   Heart disease.   Diabetes.  TREATMENT   When the aneurysm is small, it will be watched closely.   The size is followed once or twice a year with an imaging study, such as an ultrasound.   Making lifestyle changes, such as quitting smoking and changing what you eat, are very important.   Your caregiver may have you take medicine to help reduce blood pressure and high cholesterol.   An aneurysm can be repaired in 1 of 2 ways:   Placement of an endograft through small cuts in your upper thigh (groin).   Major surgery to repair the aneurysm.  LET YOUR CAREGIVER KNOW ABOUT:  Allergies.   Medicines taken including herbs, eye drops, prescription medicines (especially medicines used to "thin the blood"), aspirin and other over-the-counter medicines, and steroids (by mouth or as a cream).   History of blood  clots in your legs and/or lungs.   Previous problems with anesthetics or medicines used to numb the skin.   History of bleeding or blood problems.   Previous surgery.   Possibility of pregnancy, if this applies.   Any other important health problems.  RISKS AND COMPLICATIONS  Leaking of blood around the endograft.   Infection.   Displacement of the endograft away from the effective location.   A block in the flow of blood through the graft. In rare cases, this can cause a block of blood flow to the legs.   Possible blood clots.   Kidney problems may occur in some people.   Exposure to X-rays and radiation.   Rare rupture of the aorta even after successful endograft repair.   Possible surgical repair of the aneurysm.  BEFORE THE PROCEDURE   Your caregiver will explain the risks and benefits of endograph repair and answer your questions.   You may have a complete physical exam and tests to assess your general health.   Other tests are done to help determine the size and location of the AAA. This helps with choosing the correct size and shape of the endograph.   You may be asked to stop taking certain medicines (particularly medicines that "thin the blood") before the procedure.   You will be asked to not eat or drink anything beginning  at midnight before the procedure. You may be told to take your regular medicines with a little water on the morning of the procedure. These instructions may vary if the procedure takes place in the afternoon.  PROCEDURE   The groin area will be washed and shaved.   Small cuts (incisions) are usually made on both sides of your groin. Long, thin tubes (catheters) are passed through these incisions into your leg artery and up into the aneurysm.   Live X-ray pictures are used to guide the endograft through the catheter to the site of aneurysm.   The endograft is released to seal off the aneurysm and line the aorta.   X-rays will check the  position of the endograft and confirm placement.   The catheters are taken out, and the incisions are closed with a few stitches.   The procedure may take 1 to 3 hours.  AFTER THE PROCEDURE   You will need to lie flat for several hours. Bending the leg that has the insertion site can cause it to bleed and swell.   You may need to stay in the hospital for a few days.   You will need follow-up visits to assess the repair.   Certain tests may be done to check the function and location of the endograph after your procedure.  HOME CARE INSTRUCTIONS   Follow all the instructions given by your caregiver.   Check your incisions for signs of infection.   Keep your appointments.   Limit your activities as directed.   Be sure you know how and when to take your medicines.   Make lifestyle changes to improve your health and quality of life.  SEEK MEDICAL CARE IF:   You develop abdominal, chest or back pain.   You have an oral temperature above 102 F (38.9 C).   It is hard to breathe.   There is increased swelling around your groin site.   There is a leak of fluid/blood from the groin site.  SEEK IMMEDIATE MEDICAL CARE IF:   There is a sudden increase in swelling or bleeding around your groin site. This is a medical emergency.   There is a sudden onset of pain in your legs and/or difficulty in moving either of your legs.   You notice a red streak starting at the groin site and extending above or below that site.   You have an oral temperature above 102 F (38.9 C), not controlled by medicine.  MAKE SURE YOU:   Understand these instructions.   Will watch your condition.   Will get help right away if you are not doing well or get worse.  Document Released: 11/22/2008 Document Revised: 03/18/2011 Document Reviewed: 11/22/2008 [facility] Patient Information 2012 [place], [place].

## 2011-09-18 ENCOUNTER — Other Ambulatory Visit: Payer: Medicare Other

## 2011-09-21 DIAGNOSIS — I712 Thoracic aortic aneurysm, without rupture: Secondary | ICD-10-CM

## 2011-12-03 ENCOUNTER — Ambulatory Visit (INDEPENDENT_AMBULATORY_CARE_PROVIDER_SITE_OTHER): Payer: Medicare Other | Admitting: Cardiology

## 2011-12-03 ENCOUNTER — Encounter: Payer: Self-pay | Admitting: Cardiology

## 2011-12-03 VITALS — BP 138/68 | HR 70 | Ht 69.0 in | Wt 245.8 lb

## 2011-12-03 DIAGNOSIS — E785 Hyperlipidemia, unspecified: Secondary | ICD-10-CM

## 2011-12-03 DIAGNOSIS — R0609 Other forms of dyspnea: Secondary | ICD-10-CM

## 2011-12-03 DIAGNOSIS — I719 Aortic aneurysm of unspecified site, without rupture: Secondary | ICD-10-CM

## 2011-12-03 DIAGNOSIS — I10 Essential (primary) hypertension: Secondary | ICD-10-CM

## 2011-12-03 DIAGNOSIS — R0989 Other specified symptoms and signs involving the circulatory and respiratory systems: Secondary | ICD-10-CM

## 2011-12-03 DIAGNOSIS — I77810 Thoracic aortic ectasia: Secondary | ICD-10-CM

## 2011-12-03 DIAGNOSIS — E7800 Pure hypercholesterolemia, unspecified: Secondary | ICD-10-CM

## 2011-12-03 DIAGNOSIS — I4892 Unspecified atrial flutter: Secondary | ICD-10-CM

## 2011-12-03 DIAGNOSIS — R0602 Shortness of breath: Secondary | ICD-10-CM

## 2011-12-03 DIAGNOSIS — F17200 Nicotine dependence, unspecified, uncomplicated: Secondary | ICD-10-CM

## 2011-12-03 DIAGNOSIS — I77819 Aortic ectasia, unspecified site: Secondary | ICD-10-CM

## 2011-12-03 DIAGNOSIS — I359 Nonrheumatic aortic valve disorder, unspecified: Secondary | ICD-10-CM

## 2011-12-03 DIAGNOSIS — I350 Nonrheumatic aortic (valve) stenosis: Secondary | ICD-10-CM

## 2011-12-03 LAB — BASIC METABOLIC PANEL
BUN: 12 mg/dL (ref 6–23)
CO2: 27 mEq/L (ref 19–32)
Chloride: 108 mEq/L (ref 96–112)
Glucose, Bld: 100 mg/dL — ABNORMAL HIGH (ref 70–99)
Potassium: 4 mEq/L (ref 3.5–5.1)
Sodium: 142 mEq/L (ref 135–145)

## 2011-12-03 MED ORDER — ROSUVASTATIN CALCIUM 20 MG PO TABS: 20.0000 mg | ORAL_TABLET | Freq: Every day | ORAL | Status: DC

## 2011-12-03 NOTE — Patient Instructions (Addendum)
Increase crestor to 20mg  daily. You can take two 10mg  tablets daily at the same time and use your current supply.   Your physician recommends that you have  lab work today--BMET  Your physician recommends that you return for a FASTING lipid profile /liver profile in 2 months. This can be scheduled in the [place] office.  Your physician wants you to follow-up in: 4 months with Dr Shirlee Latch. (September 2013) A reminder letter will be sent to you 2 months in advance.  Your physician has requested that you have an echocardiogram. Echocardiography is a painless test that uses sound waves to create images of your heart. It provides your doctor with information about the size and shape of your heart and how well your heart's chambers and valves are working. This procedure takes approximately one hour. There are no restrictions for this procedure. February 2014  Dr Shirlee Latch has recommended you have a MRA of your chest in February 2014 to evaluate aortic root and ascending aorta.

## 2011-12-04 ENCOUNTER — Other Ambulatory Visit: Payer: Self-pay

## 2011-12-04 DIAGNOSIS — R0602 Shortness of breath: Secondary | ICD-10-CM

## 2011-12-04 NOTE — Progress Notes (Signed)
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  Patient had an echo in 12/11 showing EF 55-60% with mild LVH. LHC in 6/10 showed no obstructive CAD.  He has been noted by echo and cath to have mild-moderate aortic insufficiency and a moderate aortic root aneurysm.  He had an MRA of his chest in 2/13 with stable 4.6 cm aortic root measurement.  BP is better controlled on current regimen.  He is still smoking, but has cut back some.  Spiriva has been helping his breathing.  He has been chronically short of breath after walking about 100 yards.  He wheezes occasionally. No chest pain. No tachypalpitations.  Weight is down 3 lbs.   ECG:  NSR, LVH, inferior T wave inversions, poor anterior R wave progression.   Labs (8/10): LDL 95, K 4.5, creatinine 1.61, BNP 17 Labs (6/12): BNP 15, creatinine 1.2 Labs (2/13): LDL 100, HDL 35, K 4.3, creatinine 0.96, BNP 30  Allergies (verified):  No Known Drug Allergies  Past Medical History: 1. Congestive heart failure.  TTE [facility] 12/09 with EF less than 25%, severe global hypokinesis, severe LV dilation with severe LVH.  There was moderate MR, moderate-to-severe aortic insufficiency, moderate-to-severe RV dysfunction. TEE 4/10 at [facility] corroborated the decreased EF, with estimated EF of 15%; however, aortic insufficiency was only mild-to- moderate and there was only trivial mitral regurgitation.  We did check a serum protein electrophoresis, which was normal.  His TSH was normal and HIV was negative.  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  After atrial flutter ablation, echo (6/10) showed EF 55-65% with moderate LV hypertrophy and possible moderate aortic insufficiency.  LV-gram (6/10) showed EF 55% with mild to moderate aortic insufficiency on aortic root shot.  Echo (12/11): EF 55-65%,  mild LV hypertrophy, grade I diastolic dysfunction, mild aortic insufficiency, aortic root 4.3 cm.  Cardiac MRI/MRA (6/12) with EF 67%, mild AI, moderate LVH, 4.6 cm aortic root.  2. Atrial flutter.  The patient was in atrial flutter with poorly controlled rate, apparently for a long period of time.  He did have a flutter ablation April 2010 and is in sinus rhythm today.  EF corrected after ablation. 3. Coronary artery disease.  The patient had LHC 4/09 at [facility]. The only disease described was 95% proximal circumflex stenosis.  He was managed medically by Dr. Juliann Pares who was seeing him at that time and did not think that CAD was the ultimate cause of his cardiomyopathy.  We repeated LHC (6/10) showing nonobstructive CAD only.  There was no proximal CFX stenosis.   4. Mild to moderate aortic insufficiency: possibly due to aortic root dilatation.  Most recent echo (12/11) with mild AI.  Trileaflet aortic valve.  Cardiac MRI/MRA in 2/13 showed mild to moderate AI.  5. Ascending aortic aneurysm: 4.3 cm on cath in 6/10.  4.3 cm aortic root by echo in 12/11.  4.6 cm aortic root by cardiac MRI/MRA in 6/12 (MRI also showed moderate LVH, EF 67%).  4.6 cm aortic root at sinuses of valsalva by cardiac MRI/MRA in 2/13.  6. Hypertension. 7. Hyperlipidemia. 8. Type 2 diabetes. 9. Chronic kidney disease 10. COPD.  Active smoker.    Family History: Father:dec 62  Htn CHF Mother:dec  41 ?WHY  Brother A Afib, Cardiomegaly Has Defibrillator Brother A Brother A Brother A Brother A Brother dec 56 Kidney failure, declined  dialysis CV: NEG. HBP: + SELF DM: NEGATIVE BREAST/OVARIAN/UTERINE CANCER: NEG. DEPRESSION: NEG. ETOH/DRUG ABUSE: NEG.   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  Etoh yes Drug use-no  Review of Systems        All systems  reviewed and negative except per HPI.   Current Outpatient Prescriptions  Medication Sig Dispense Refill  . acetaminophen (TYLENOL) 500 MG tablet Take 500 mg by mouth every 6 (six) hours as needed.      Marland Kitchen amLODipine (NORVASC) 10 MG tablet Take 1 tablet (10 mg total) by mouth daily.  30 tablet  6  . aspirin 81 MG EC tablet Take 81 mg by mouth daily.        . Blood Glucose Monitoring Suppl (ULTIMA) KIT daily.        . carvedilol (COREG CR) 40 MG 24 hr capsule Take 40 mg by mouth 2 (two) times daily.        . fish oil-omega-3 fatty acids 1000 MG capsule Take 2 g by mouth daily.      . Fluticasone-Salmeterol (ADVAIR) 250-50 MCG/DOSE AEPB Inhale 1 puff into the lungs every 12 (twelve) hours.      . furosemide (LASIX) 40 MG tablet Take 1 tablet (40 mg total) by mouth daily.  30 tablet  6  . hydrALAZINE (APRESOLINE) 50 MG tablet TAKE TWO TABLETS BY MOUTH THREE TIMES DAILY  180 tablet  3  . nitroGLYCERIN (NITROSTAT) 0.4 MG SL tablet Place 0.4 mg under the tongue every 5 (five) minutes as needed.        Marland Kitchen spironolactone (ALDACTONE) 25 MG tablet Take 1/2 tablet daily.      Marland Kitchen terbinafine (LAMISIL) 250 MG tablet Take 250 mg by mouth daily.        Marland Kitchen tiotropium (SPIRIVA HANDIHALER) 18 MCG inhalation capsule Place 1 capsule (18 mcg total) into inhaler and inhale daily.  30 capsule  1  . valsartan (DIOVAN) 320 MG tablet Take 320 mg by mouth daily.        . rosuvastatin (CRESTOR) 20 MG tablet Take 1 tablet (20 mg total) by mouth daily.  30 tablet  3    BP 138/68  Pulse 70  Ht 5\' 9"  (1.753 m)  Wt 245 lb 12.8 oz (111.494 kg)  BMI 36.30 kg/m2 General: NAD, obese Neck: JVP 7 cm, no thyromegaly or thyroid nodule.  Lungs: Decreased breath sounds bilaterally.  CV: Nondisplaced PMI.  Heart regular S1/S2, no S3/S4, no murmur.  No peripheral edema.  No carotid bruit.  Normal pedal pulses.  Abdomen: Soft, nontender, no hepatosplenomegaly, no distention.  Neurologic: Alert and oriented x 3.  Psych: Normal  affect. Extremities: No clubbing or cyanosis.

## 2011-12-04 NOTE — Assessment & Plan Note (Signed)
Stable 4.6 cm dilation of the aortic root at the sinuses of valsalva.  Repeat MRA chest in 2/14.

## 2011-12-04 NOTE — Assessment & Plan Note (Signed)
I strongly counselled him to quit smoking.

## 2011-12-04 NOTE — Assessment & Plan Note (Signed)
No known recurrence since flutter ablation.  

## 2011-12-04 NOTE — Assessment & Plan Note (Signed)
Exertional dyspnea, stable.  He is not volume overloaded on exam.  I think that COPD is playing a large role here.  He needs to quit smoking.  Spiriva has helped.

## 2011-12-04 NOTE — Assessment & Plan Note (Signed)
BP is well controlled 

## 2011-12-04 NOTE — Assessment & Plan Note (Signed)
Mild to moderate AI on cardiac MRI in 2/13.  AI is associated with the dilation of the aortic root.  Patient will get repeat echo in 2/14.

## 2011-12-04 NOTE — Assessment & Plan Note (Signed)
History of CAD.  Goal LDL < 70, will increase Crestor to 20 mg daily with lipids/LFTs in 2 months.

## 2011-12-18 ENCOUNTER — Other Ambulatory Visit (INDEPENDENT_AMBULATORY_CARE_PROVIDER_SITE_OTHER): Payer: Medicare Other

## 2011-12-18 DIAGNOSIS — R0602 Shortness of breath: Secondary | ICD-10-CM

## 2011-12-18 LAB — BASIC METABOLIC PANEL
BUN: 11 mg/dL (ref 6–23)
CO2: 29 mEq/L (ref 19–32)
Calcium: 9 mg/dL (ref 8.4–10.5)
Chloride: 104 mEq/L (ref 96–112)
Creatinine, Ser: 1.3 mg/dL (ref 0.4–1.5)
Glucose, Bld: 93 mg/dL (ref 70–99)

## 2012-02-04 ENCOUNTER — Ambulatory Visit (INDEPENDENT_AMBULATORY_CARE_PROVIDER_SITE_OTHER): Payer: Medicare Other

## 2012-02-04 DIAGNOSIS — E785 Hyperlipidemia, unspecified: Secondary | ICD-10-CM

## 2012-02-04 DIAGNOSIS — E7800 Pure hypercholesterolemia, unspecified: Secondary | ICD-10-CM

## 2012-02-04 DIAGNOSIS — R0989 Other specified symptoms and signs involving the circulatory and respiratory systems: Secondary | ICD-10-CM

## 2012-02-04 DIAGNOSIS — I10 Essential (primary) hypertension: Secondary | ICD-10-CM

## 2012-02-04 DIAGNOSIS — R0602 Shortness of breath: Secondary | ICD-10-CM

## 2012-02-05 ENCOUNTER — Other Ambulatory Visit: Payer: Self-pay | Admitting: *Deleted

## 2012-02-05 DIAGNOSIS — E785 Hyperlipidemia, unspecified: Secondary | ICD-10-CM

## 2012-02-05 LAB — HEPATIC FUNCTION PANEL
ALT: 7 IU/L (ref 0–44)
AST: 10 IU/L (ref 0–40)
Albumin: 4 g/dL (ref 3.5–5.5)
Total Bilirubin: 0.5 mg/dL (ref 0.0–1.2)

## 2012-02-05 LAB — LIPID PANEL
Chol/HDL Ratio: 4.8 ratio units (ref 0.0–5.0)
Cholesterol, Total: 164 mg/dL (ref 100–199)
LDL Calculated: 110 mg/dL — ABNORMAL HIGH (ref 0–99)
VLDL Cholesterol Cal: 20 mg/dL (ref 5–40)

## 2012-02-05 LAB — BASIC METABOLIC PANEL
BUN/Creatinine Ratio: 10 (ref 9–20)
Chloride: 102 mmol/L (ref 97–108)
GFR calc Af Amer: 75 mL/min/{1.73_m2} (ref >59)
GFR calc non Af Amer: 64 mL/min/{1.73_m2} (ref >59)
Potassium: 4.3 mmol/L (ref 3.5–5.2)

## 2012-04-07 ENCOUNTER — Other Ambulatory Visit (INDEPENDENT_AMBULATORY_CARE_PROVIDER_SITE_OTHER): Payer: Medicare Other

## 2012-04-07 ENCOUNTER — Ambulatory Visit (INDEPENDENT_AMBULATORY_CARE_PROVIDER_SITE_OTHER): Payer: Medicare Other | Admitting: Cardiology

## 2012-04-07 ENCOUNTER — Encounter: Payer: Self-pay | Admitting: Cardiology

## 2012-04-07 VITALS — BP 140/71 | HR 65 | Resp 18 | Ht 69.0 in | Wt 240.8 lb

## 2012-04-07 DIAGNOSIS — E785 Hyperlipidemia, unspecified: Secondary | ICD-10-CM

## 2012-04-07 DIAGNOSIS — R0602 Shortness of breath: Secondary | ICD-10-CM

## 2012-04-07 DIAGNOSIS — I359 Nonrheumatic aortic valve disorder, unspecified: Secondary | ICD-10-CM

## 2012-04-07 DIAGNOSIS — I351 Nonrheumatic aortic (valve) insufficiency: Secondary | ICD-10-CM

## 2012-04-07 DIAGNOSIS — F17200 Nicotine dependence, unspecified, uncomplicated: Secondary | ICD-10-CM

## 2012-04-07 DIAGNOSIS — I4892 Unspecified atrial flutter: Secondary | ICD-10-CM

## 2012-04-07 DIAGNOSIS — I719 Aortic aneurysm of unspecified site, without rupture: Secondary | ICD-10-CM

## 2012-04-07 DIAGNOSIS — I10 Essential (primary) hypertension: Secondary | ICD-10-CM

## 2012-04-07 LAB — HEPATIC FUNCTION PANEL
ALT: 13 U/L (ref 0–53)
AST: 13 U/L (ref 0–37)
Albumin: 3.8 g/dL (ref 3.5–5.2)
Total Bilirubin: 0.7 mg/dL (ref 0.3–1.2)

## 2012-04-07 LAB — LIPID PANEL
HDL: 30.2 mg/dL — ABNORMAL LOW (ref >39.00)
Triglycerides: 99 mg/dL (ref 0.0–149.0)
VLDL: 19.8 mg/dL (ref 0.0–40.0)

## 2012-04-07 NOTE — Patient Instructions (Signed)
Your physician recommends that you have  a FASTING lipid profile /liver profile/BMET today.  Your physician has requested that you have an echocardiogram. Echocardiography is a painless test that uses sound waves to create images of your heart. It provides your doctor with information about the size and shape of your heart and how well your heart's chambers and valves are working. This procedure takes approximately one hour. There are no restrictions for this procedure. February 2014.  Schedule an appointment for an MRA of your chest in February 2014.  Your physician wants you to follow-up in: 6 months with Dr Shirlee Latch. (March 2014).  You will receive a reminder letter in the mail two months in advance. If you don't receive a letter, please call our office to schedule the follow-up appointment.

## 2012-04-07 NOTE — Progress Notes (Signed)
Patient ID: Bobby Gray, male   DOB: [birth year], 61 y.o.   MRN: [id number] 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  Patient had an echo in 12/11 showing EF 55-60% with mild LVH. LHC in 6/10 showed no obstructive CAD.  He has been noted by echo and cath to have mild-moderate aortic insufficiency and a moderate aortic root aneurysm.  He had an MRA of his chest in 2/13 with stable 4.6 cm aortic root measurement.  BP is better controlled on current regimen.  He is still smoking, but has cut to 1-2 cigs/day.  Spiriva has been helping his breathing.  He is not short of breath walking on flat ground, some DOE with steps.  No chest pain. No tachypalpitations.  Weight is down 5 lbs.   Labs (8/10): LDL 95, K 4.5, creatinine 1.47, BNP 17 Labs (6/12): BNP 15, creatinine 1.2 Labs (2/13): LDL 100, HDL 35, K 4.3, creatinine 8.29, BNP 30 Labs (7/13): BNP 9.6, LDL 40, HDL 34, K 4.3, creatinine 5.62  Allergies (verified):  No Known Drug Allergies  Past Medical History: 1. Congestive heart failure.  TTE [facility] 12/09 with EF less than 25%, severe global hypokinesis, severe LV dilation with severe LVH.  There was moderate MR, moderate-to-severe aortic insufficiency, moderate-to-severe RV dysfunction. TEE 4/10 at [facility] corroborated the decreased EF, with estimated EF of 15%; however, aortic insufficiency was only mild-to- moderate and there was only trivial mitral regurgitation.  We did check a serum protein electrophoresis, which was normal.  His TSH was normal and HIV was negative.  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  After atrial flutter ablation, echo (6/10) showed EF 55-65% with moderate LV hypertrophy and possible moderate aortic insufficiency.  LV-gram (6/10) showed EF 55% with mild to moderate aortic insufficiency on aortic  root shot.  Echo (12/11): EF 55-65%, mild LV hypertrophy, grade I diastolic dysfunction, mild aortic insufficiency, aortic root 4.3 cm.  Cardiac MRI/MRA (6/12) with EF 67%, mild AI, moderate LVH, 4.6 cm aortic root.  2. Atrial flutter.  The patient was in atrial flutter with poorly controlled rate, apparently for a long period of time.  He did have a flutter ablation April 2010 and is in sinus rhythm today.  EF corrected after ablation. 3. Coronary artery disease.  The patient had LHC 4/09 at [facility]. The only disease described was 95% proximal circumflex stenosis.  He was managed medically by Dr. Juliann Pares who was seeing him at that time and did not think that CAD was the ultimate cause of his cardiomyopathy.  We repeated LHC (6/10) showing nonobstructive CAD only.  There was no proximal CFX stenosis.   4. Mild to moderate aortic insufficiency: possibly due to aortic root dilatation.  Most recent echo (12/11) with mild AI.  Trileaflet aortic valve.  Cardiac MRI/MRA in 2/13 showed mild to moderate AI.  5. Ascending aortic aneurysm: 4.3 cm on cath in 6/10.  4.3 cm aortic root by echo in 12/11.  4.6 cm aortic root by cardiac MRI/MRA in 6/12 (MRI also showed moderate LVH, EF 67%).  4.6 cm aortic root at sinuses of valsalva by cardiac MRI/MRA in 2/13.  6. Hypertension. 7. Hyperlipidemia. 8. Type 2 diabetes. 9. Chronic kidney disease 10. COPD.  Active smoker.    Family History: Father:dec 62  Htn CHF Mother:dec  41 ?WHY  Brother A Afib, Cardiomegaly Has Defibrillator Brother  A Brother A Brother A Brother A Brother dec 56 Kidney failure, declined dialysis CV: NEG. HBP: + SELF DM: NEGATIVE BREAST/OVARIAN/UTERINE CANCER: NEG. DEPRESSION: NEG. ETOH/DRUG ABUSE: NEG.   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  Etoh yes Drug  use-no   Current Outpatient Prescriptions  Medication Sig Dispense Refill  . acetaminophen (TYLENOL) 500 MG tablet Take 500 mg by mouth every 6 (six) hours as needed.      Marland Kitchen amLODipine (NORVASC) 10 MG tablet Take 1 tablet (10 mg total) by mouth daily.  30 tablet  6  . aspirin 81 MG EC tablet Take 81 mg by mouth daily.        . Blood Glucose Monitoring Suppl (ULTIMA) KIT daily.        . carvedilol (COREG CR) 40 MG 24 hr capsule Take 40 mg by mouth 2 (two) times daily.        . fish oil-omega-3 fatty acids 1000 MG capsule Take 2 g by mouth daily.      . Fluticasone-Salmeterol (ADVAIR) 250-50 MCG/DOSE AEPB Inhale 1 puff into the lungs every 12 (twelve) hours.      . furosemide (LASIX) 40 MG tablet Take 0.5 tablets (20 mg total) by mouth daily.  1 tablet  0  . hydrALAZINE (APRESOLINE) 50 MG tablet TAKE TWO TABLETS BY MOUTH THREE TIMES DAILY  180 tablet  3  . nitroGLYCERIN (NITROSTAT) 0.4 MG SL tablet Place 0.4 mg under the tongue every 5 (five) minutes as needed.        . rosuvastatin (CRESTOR) 20 MG tablet Take 1 tablet (20 mg total) by mouth daily.  30 tablet  3  . spironolactone (ALDACTONE) 25 MG tablet Take 1/2 tablet daily.      Marland Kitchen terbinafine (LAMISIL) 250 MG tablet Take 250 mg by mouth daily.        Marland Kitchen tiotropium (SPIRIVA HANDIHALER) 18 MCG inhalation capsule Place 1 capsule (18 mcg total) into inhaler and inhale daily.  30 capsule  1  . valsartan (DIOVAN) 320 MG tablet Take 320 mg by mouth daily.          BP 140/71  Pulse 65  Resp 18  Ht 5\' 9"  (1.753 m)  Wt 240 lb 12.8 oz (109.226 kg)  BMI 35.56 kg/m2  SpO2 91% General: NAD, obese Neck: JVP 7 cm, no thyromegaly or thyroid nodule.  Lungs: Decreased breath sounds bilaterally with occasional rhonchi.  CV: Nondisplaced PMI.  Heart regular S1/S2, no S3/S4, no murmur.  No peripheral edema.  No carotid bruit.  Normal pedal pulses.  Abdomen: Soft, nontender, no hepatosplenomegaly, no distention.  Neurologic: Alert and oriented x 3.   Psych: Normal affect. Extremities: No clubbing or cyanosis.   Assessment/Plan:  1. AORTIC ANEURYSM Stable 4.6 cm dilation of the aortic root at the sinuses of valsalva. Repeat MRA chest in 2/14.  2. AORTIC INSUFFICIENCY  Mild to moderate AI on cardiac MRI in 2/13. AI is associated with the dilation of the aortic root. Patient will get repeat echo in 2/14.  3. ATRIAL FLUTTER  No known recurrence since flutter ablation.  4. HYPERLIPIDEMIA  History of CAD. Goal LDL < 70. Lipids at goal in 7/13. 5. HYPERTENSION  BP control seems reasonable.  6. NICOTINE ADDICTION  I strongly counselled him to quit smoking.  He has cut back.  7. SHORTNESS OF BREATH  Exertional dyspnea, stable. He is not volume overloaded on exam. I think that COPD is playing a large role here. He needs to quit smoking. Spiriva has helped.   Marca Ancona 04/07/2012

## 2012-04-12 ENCOUNTER — Telehealth: Payer: Self-pay | Admitting: Endocrinology

## 2012-04-12 NOTE — Telephone Encounter (Signed)
Caller: Joyce/Patient; Phone: [phone]; Reason for Call: Had blood work done last week, said someone from here called and left call back number [phone], but said they are Dr.  Gaynelle Adu pt.  On 3rd floor, but i have no Dr.  Gaynelle Adu listed. jms/can

## 2012-08-10 ENCOUNTER — Other Ambulatory Visit: Payer: Self-pay | Admitting: Cardiology

## 2012-08-18 ENCOUNTER — Ambulatory Visit (HOSPITAL_COMMUNITY)
Admission: RE | Admit: 2012-08-18 | Discharge: 2012-08-18 | Disposition: A | Discharge status: Home / Self Care | Payer: Medicare Other | Source: Ambulatory Visit | Attending: Cardiology | Admitting: Cardiology

## 2012-08-18 DIAGNOSIS — I77810 Thoracic aortic ectasia: Secondary | ICD-10-CM | POA: Insufficient documentation

## 2012-08-18 MED ORDER — GADOBENATE DIMEGLUMINE 529 MG/ML IV SOLN
20.0000 mL | Freq: Once | INTRAVENOUS | Status: AC | PRN
  Administered 2012-08-18: 20 mL via INTRAVENOUS

## 2012-08-19 LAB — POCT I-STAT, CHEM 8
BUN: 11 mg/dL (ref 6–23)
Calcium, Ion: 1.16 mmol/L (ref 1.13–1.30)
Creatinine, Ser: 1.4 mg/dL — ABNORMAL HIGH (ref 0.50–1.35)
Glucose, Bld: 88 mg/dL (ref 70–99)
Hemoglobin: 19 g/dL — ABNORMAL HIGH (ref 13.0–17.0)
Sodium: 141 mEq/L (ref 135–145)
TCO2: 29 mmol/L (ref 0–100)

## 2012-08-22 ENCOUNTER — Ambulatory Visit (HOSPITAL_COMMUNITY): Payer: Medicare Other | Attending: Cardiovascular Disease

## 2012-08-22 ENCOUNTER — Ambulatory Visit (HOSPITAL_COMMUNITY): Admission: RE | Admit: 2012-08-22 | Payer: Medicare Other | Source: Ambulatory Visit

## 2012-08-22 DIAGNOSIS — I10 Essential (primary) hypertension: Secondary | ICD-10-CM | POA: Insufficient documentation

## 2012-08-22 DIAGNOSIS — I428 Other cardiomyopathies: Secondary | ICD-10-CM | POA: Insufficient documentation

## 2012-08-22 DIAGNOSIS — I4891 Unspecified atrial fibrillation: Secondary | ICD-10-CM | POA: Insufficient documentation

## 2012-08-22 DIAGNOSIS — R0609 Other forms of dyspnea: Secondary | ICD-10-CM | POA: Insufficient documentation

## 2012-08-22 DIAGNOSIS — E119 Type 2 diabetes mellitus without complications: Secondary | ICD-10-CM | POA: Insufficient documentation

## 2012-08-22 DIAGNOSIS — E785 Hyperlipidemia, unspecified: Secondary | ICD-10-CM | POA: Insufficient documentation

## 2012-08-22 DIAGNOSIS — I351 Nonrheumatic aortic (valve) insufficiency: Secondary | ICD-10-CM

## 2012-08-22 DIAGNOSIS — I4892 Unspecified atrial flutter: Secondary | ICD-10-CM | POA: Insufficient documentation

## 2012-08-22 DIAGNOSIS — R0989 Other specified symptoms and signs involving the circulatory and respiratory systems: Secondary | ICD-10-CM | POA: Insufficient documentation

## 2012-08-22 DIAGNOSIS — I359 Nonrheumatic aortic valve disorder, unspecified: Secondary | ICD-10-CM

## 2012-08-22 DIAGNOSIS — R5381 Other malaise: Secondary | ICD-10-CM | POA: Insufficient documentation

## 2012-08-22 NOTE — Progress Notes (Signed)
Echocardiogram performed.  

## 2012-09-16 DIAGNOSIS — I359 Nonrheumatic aortic valve disorder, unspecified: Secondary | ICD-10-CM

## 2012-09-21 ENCOUNTER — Telehealth: Payer: Self-pay | Admitting: Cardiology

## 2012-09-21 NOTE — Telephone Encounter (Signed)
Pt rtn call to Ann from late yesterday evening

## 2012-09-21 NOTE — Telephone Encounter (Signed)
Pt provided with results of MRA of chest.

## 2012-11-10 ENCOUNTER — Other Ambulatory Visit: Payer: Self-pay | Admitting: Cardiology

## 2012-11-11 ENCOUNTER — Encounter: Payer: Self-pay | Admitting: *Deleted

## 2013-05-15 ENCOUNTER — Emergency Department: Payer: Self-pay

## 2013-05-15 LAB — URINALYSIS, COMPLETE
Bacteria: NONE SEEN
Bilirubin,UR: NEGATIVE
Blood: NEGATIVE
Glucose,UR: NEGATIVE mg/dL (ref 0–75)
Leukocyte Esterase: NEGATIVE
Ph: 5 (ref 4.5–8.0)
RBC,UR: 1 /HPF (ref 0–5)
Specific Gravity: 1.019 (ref 1.003–1.030)
Squamous Epithelial: NONE SEEN
WBC UR: 5 /HPF (ref 0–5)

## 2013-05-15 LAB — COMPREHENSIVE METABOLIC PANEL
Albumin: 3.5 g/dL (ref 3.4–5.0)
Alkaline Phosphatase: 96 U/L (ref 50–136)
BUN: 13 mg/dL (ref 7–18)
EGFR (Non-African Amer.): 58 — ABNORMAL LOW
Glucose: 85 mg/dL (ref 65–99)
Potassium: 4.1 mmol/L (ref 3.5–5.1)
SGOT(AST): 16 U/L (ref 15–37)
SGPT (ALT): 17 U/L (ref 12–78)
Sodium: 136 mmol/L (ref 136–145)

## 2013-05-15 LAB — CBC WITH DIFFERENTIAL/PLATELET
Basophil %: 1.2 %
Eosinophil #: 0.1 10*3/uL (ref 0.0–0.7)
Lymphocyte %: 31.1 %
MCHC: 33.2 g/dL (ref 32.0–36.0)
Monocyte #: 0.6 x10 3/mm (ref 0.2–1.0)
Neutrophil %: 55.9 %
RBC: 5.64 10*6/uL (ref 4.40–5.90)
RDW: 14.1 % (ref 11.5–14.5)
WBC: 6 10*3/uL (ref 3.8–10.6)

## 2013-05-15 LAB — DIGOXIN LEVEL: Digoxin: <0.1 ng/mL — ABNORMAL LOW

## 2013-11-01 ENCOUNTER — Inpatient Hospital Stay: Payer: Self-pay | Admitting: Internal Medicine

## 2013-11-01 LAB — CBC
HCT: 57.1 % — AB (ref 40.0–52.0)
HGB: 18.6 g/dL — AB (ref 13.0–18.0)
MCH: 31.5 pg (ref 26.0–34.0)
MCHC: 32.5 g/dL (ref 32.0–36.0)
MCV: 97 fL (ref 80–100)
PLATELETS: 168 10*3/uL (ref 150–440)
RBC: 5.89 10*6/uL (ref 4.40–5.90)
RDW: 15.1 % — AB (ref 11.5–14.5)
WBC: 8.1 10*3/uL (ref 3.8–10.6)

## 2013-11-01 LAB — BASIC METABOLIC PANEL
ANION GAP: 6 — AB (ref 7–16)
BUN: 13 mg/dL (ref 7–18)
CHLORIDE: 104 mmol/L (ref 98–107)
CREATININE: 1.34 mg/dL — AB (ref 0.60–1.30)
Calcium, Total: 8.7 mg/dL (ref 8.5–10.1)
Co2: 28 mmol/L (ref 21–32)
EGFR (African American): >60
EGFR (Non-African Amer.): 57 — ABNORMAL LOW
GLUCOSE: 98 mg/dL (ref 65–99)
OSMOLALITY: 276 (ref 275–301)
Potassium: 4.2 mmol/L (ref 3.5–5.1)
Sodium: 138 mmol/L (ref 136–145)

## 2013-11-01 LAB — TROPONIN I
TROPONIN-I: 0.03 ng/mL
TROPONIN-I: 0.04 ng/mL

## 2013-11-01 LAB — PRO B NATRIURETIC PEPTIDE: B-TYPE NATIURETIC PEPTID: 3353 pg/mL — AB (ref 0–125)

## 2013-11-01 LAB — MAGNESIUM: Magnesium: 1.7 mg/dL — ABNORMAL LOW

## 2013-11-02 LAB — BASIC METABOLIC PANEL
ANION GAP: 5 — AB (ref 7–16)
BUN: 19 mg/dL — AB (ref 7–18)
CO2: 30 mmol/L (ref 21–32)
CREATININE: 1.35 mg/dL — AB (ref 0.60–1.30)
Calcium, Total: 8.9 mg/dL (ref 8.5–10.1)
Chloride: 102 mmol/L (ref 98–107)
GFR CALC NON AF AMER: 56 — AB
GLUCOSE: 110 mg/dL — AB (ref 65–99)
Osmolality: 277 (ref 275–301)
POTASSIUM: 4.4 mmol/L (ref 3.5–5.1)
SODIUM: 137 mmol/L (ref 136–145)

## 2013-11-02 LAB — TROPONIN I: Troponin-I: 0.02 ng/mL

## 2013-11-03 LAB — BASIC METABOLIC PANEL
ANION GAP: 5 — AB (ref 7–16)
Anion Gap: 7 (ref 7–16)
BUN: 28 mg/dL — AB (ref 7–18)
BUN: 30 mg/dL — ABNORMAL HIGH (ref 7–18)
CALCIUM: 8.7 mg/dL (ref 8.5–10.1)
CHLORIDE: 98 mmol/L (ref 98–107)
CO2: 31 mmol/L (ref 21–32)
CREATININE: 1.6 mg/dL — AB (ref 0.60–1.30)
Calcium, Total: 9.1 mg/dL (ref 8.5–10.1)
Chloride: 98 mmol/L (ref 98–107)
Co2: 30 mmol/L (ref 21–32)
Creatinine: 1.74 mg/dL — ABNORMAL HIGH (ref 0.60–1.30)
EGFR (African American): 53 — ABNORMAL LOW
EGFR (Non-African Amer.): 41 — ABNORMAL LOW
GFR CALC AF AMER: 48 — AB
GFR CALC NON AF AMER: 46 — AB
GLUCOSE: 167 mg/dL — AB (ref 65–99)
Glucose: 174 mg/dL — ABNORMAL HIGH (ref 65–99)
Osmolality: 279 (ref 275–301)
Osmolality: 279 (ref 275–301)
POTASSIUM: 3.7 mmol/L (ref 3.5–5.1)
Potassium: 4 mmol/L (ref 3.5–5.1)
SODIUM: 135 mmol/L — AB (ref 136–145)
SODIUM: 134 mmol/L — AB (ref 136–145)

## 2013-11-03 LAB — HEMOGLOBIN: HGB: 17.6 g/dL (ref 13.0–18.0)

## 2013-11-06 ENCOUNTER — Encounter: Payer: Self-pay | Admitting: *Deleted

## 2013-11-06 ENCOUNTER — Telehealth: Payer: Self-pay

## 2013-11-06 ENCOUNTER — Other Ambulatory Visit: Payer: Self-pay | Admitting: Cardiology

## 2013-11-06 NOTE — Telephone Encounter (Signed)
Patient contacted regarding discharge from [facility] on 11/03/13.  Patient understands to follow up with Ignacia Bayley, NP on 11/08/13 at 2:45 at [facility]. Patient understands discharge instructions? yes Patient understands medications and regiment? yes Patient understands to bring all medications to this visit? yes

## 2013-11-08 ENCOUNTER — Ambulatory Visit (INDEPENDENT_AMBULATORY_CARE_PROVIDER_SITE_OTHER): Payer: Medicare Other | Admitting: Nurse Practitioner

## 2013-11-08 ENCOUNTER — Encounter: Payer: Self-pay | Admitting: Nurse Practitioner

## 2013-11-08 VITALS — BP 140/84 | HR 69 | Ht 69.0 in | Wt 237.2 lb

## 2013-11-08 DIAGNOSIS — I712 Thoracic aortic aneurysm, without rupture: Secondary | ICD-10-CM

## 2013-11-08 DIAGNOSIS — I2510 Atherosclerotic heart disease of native coronary artery without angina pectoris: Secondary | ICD-10-CM | POA: Insufficient documentation

## 2013-11-08 DIAGNOSIS — E785 Hyperlipidemia, unspecified: Secondary | ICD-10-CM | POA: Insufficient documentation

## 2013-11-08 DIAGNOSIS — I7121 Aneurysm of the ascending aorta, without rupture: Secondary | ICD-10-CM

## 2013-11-08 DIAGNOSIS — J449 Chronic obstructive pulmonary disease, unspecified: Secondary | ICD-10-CM | POA: Insufficient documentation

## 2013-11-08 DIAGNOSIS — I351 Nonrheumatic aortic (valve) insufficiency: Secondary | ICD-10-CM | POA: Insufficient documentation

## 2013-11-08 DIAGNOSIS — I10 Essential (primary) hypertension: Secondary | ICD-10-CM

## 2013-11-08 DIAGNOSIS — I5032 Chronic diastolic (congestive) heart failure: Secondary | ICD-10-CM

## 2013-11-08 DIAGNOSIS — J9600 Acute respiratory failure, unspecified whether with hypoxia or hypercapnia: Secondary | ICD-10-CM

## 2013-11-08 DIAGNOSIS — I509 Heart failure, unspecified: Secondary | ICD-10-CM | POA: Insufficient documentation

## 2013-11-08 DIAGNOSIS — I4892 Unspecified atrial flutter: Secondary | ICD-10-CM

## 2013-11-08 DIAGNOSIS — I480 Paroxysmal atrial fibrillation: Secondary | ICD-10-CM | POA: Insufficient documentation

## 2013-11-08 DIAGNOSIS — N182 Chronic kidney disease, stage 2 (mild): Secondary | ICD-10-CM | POA: Insufficient documentation

## 2013-11-08 DIAGNOSIS — I428 Other cardiomyopathies: Secondary | ICD-10-CM

## 2013-11-08 NOTE — Progress Notes (Signed)
Patient Name: Bobby Gray Date of Encounter: 11/08/2013  Primary Care Provider:  Volanda Napoleon, MD Primary Cardiologist:  Einar Crow, MD   Patient Profile  63 y/o male with a h/o tachy-mediated CM with subsequent normalization of LV fxn who was recently admitted to [facility] with acute resp failure/copd flare/? chf.  Problem List   Past Medical History  Diagnosis Date  . CHF (congestive heart failure)     a. previously systolic, now diastolic.  . Atrial flutter     a. 12/2008 s/p Aflutter RFCA.  Marland Kitchen Coronary artery disease     a. 12/2008 Cath: nonobs CAD.  Marland Kitchen Aortic insufficiency     a. 08/2012 Echo: Mild AI.  Marland Kitchen Ascending aortic aneurysm     a. moderately dilated to 4.6 cm at the Sinuses of Valsalva. ascending aorta was mildly dilated to 4.2 cm - stable.  . Hypertension   . Hyperlipidemia   . Diabetes mellitus   . Tobacco abuse   . Chronic bronchitis   . Cardiomyopathy     a. Presumed to have been tachycardia mediated - 10/2008 Echo: EF 15%;  b. 08/2012 Echo: EF 60-65%, mod LVH, mild AI, mod bi-atrial enlargement.  Marland Kitchen PAF (paroxysmal atrial fibrillation)   . CKD (chronic kidney disease), stage II   . COPD (chronic obstructive pulmonary disease)    Past Surgical History  Procedure Laterality Date  . Esophagogastroduodenoscopy  10/2002    H. Pylori via biopsy Nicolasa Ducking)  . Colonoscopy w/ polypectomy  10/2002    Nicolasa Ducking  . Ct abd w & pelvis wo cm  09/29/2006    Chest w/ nml  . Ct thoracic spine  09/29/2006    w/ nml  . Spect ett  06/15/2007    Mod inf defect w/o ischemia depr LVF (Dr Clayborn Bigness)  . Tee without cardioversion  11/14/2008    LVE EF 15% Diffuse hypokinesis mild-mod AR LAE RAE mod TR  . Cardiac catheterization  01/11/2009    Mild- mod AI, EF 55% 30-40% stenosis first diag LAD    Allergies  No Known Allergies  HPI  63 y/o male with the above complex problem list.  He has a h/o non-ischemic tachy-mediated cardiomyopathy in the setting of atrial flutter s/p  RFCA with subsequent normalization of LV fxn.  He also has a h/o Asc Ao aneurysm, which has been followed annually with MR angio, and has been stable.  Last MR angio was 08/2012.  Pt was admitted to [facility] last week with acute resp failure and COPD flare req inhalers and steroids.  He was hypertensive and there was some concern for CHF req diuresis.  With diuresis, his creat bumped and this was subsequently d/c'd.  As renal fxn stabilized, his ARB dose was titrated for improved BP control and he was discharged with f/u arranged for today.  Since discharge, he reports doing well w/o recurrent dyspnea.  He weighs himself from time to time and says that it has been stable.  He denies chest pain, palpitations, dyspnea, pnd, orthopnea, n, v, dizziness, syncope, edema, weight gain, or early satiety. He remains on a steroid taper.  Home Medications  Prior to Admission medications   Medication Sig Start Date End Date Taking? Authorizing Provider  acetaminophen (TYLENOL) 500 MG tablet Take 500 mg by mouth every 6 (six) hours as needed.   Yes Historical Provider, MD  amLODipine (NORVASC) 10 MG tablet Take 1 tablet (10 mg total) by mouth daily. 01/09/11  Yes Larey Dresser,  MD  aspirin 81 MG EC tablet Take 81 mg by mouth daily.     Yes Historical Provider, MD  Blood Glucose Monitoring Suppl (ULTIMA) KIT daily.     Yes Historical Provider, MD  carvedilol (COREG) 25 MG tablet Take 25 mg by mouth 2 (two) times daily with a meal.   Yes Historical Provider, MD  fish oil-omega-3 fatty acids 1000 MG capsule Take 2 g by mouth daily.   Yes Historical Provider, MD  Fluticasone-Salmeterol (ADVAIR) 250-50 MCG/DOSE AEPB Inhale 1 puff into the lungs every 12 (twelve) hours.   Yes Historical Provider, MD  furosemide (LASIX) 40 MG tablet Take 40 mg by mouth daily.  12/04/11  Yes Laurey Morale, MD  hydrALAZINE (APRESOLINE) 50 MG tablet TAKE TWO TABLETS BY MOUTH THREE TIMES DAILY   Yes Laurey Morale, MD  Ipratropium-Albuterol  (COMBIVENT) 20-100 MCG/ACT AERS respimat Inhale 1 puff into the lungs every 6 (six) hours.   Yes Historical Provider, MD  nicotine (NICODERM CQ - DOSED IN MG/24 HOURS) 14 mg/24hr patch Place 14 mg onto the skin daily.   Yes Historical Provider, MD  nitroGLYCERIN (NITROSTAT) 0.4 MG SL tablet Place 0.4 mg under the tongue every 5 (five) minutes as needed.     Yes Historical Provider, MD  predniSONE (DELTASONE) 10 MG tablet Take 10 mg by mouth daily with breakfast.   Yes Historical Provider, MD  spironolactone (ALDACTONE) 25 MG tablet Take 0.5 tablets (12.5 mg total) by mouth daily. 11/10/12  Yes Laurey Morale, MD  terbinafine (LAMISIL) 250 MG tablet Take 250 mg by mouth daily.     Yes Historical Provider, MD  valsartan (DIOVAN) 320 MG tablet Take 320 mg by mouth daily.     Yes Historical Provider, MD  rosuvastatin (CRESTOR) 20 MG tablet Take 1 tablet (20 mg total) by mouth daily. 12/03/11 11/06/13  Laurey Morale, MD  tiotropium (SPIRIVA HANDIHALER) 18 MCG inhalation capsule Place 1 capsule (18 mcg total) into inhaler and inhale daily. 09/02/11 11/06/13  Laurey Morale, MD    Review of Systems  As above, he is doing well since discharge.  He denies chest pain, palpitations, dyspnea, pnd, orthopnea, n, v, dizziness, syncope, edema, weight gain, or early satiety.  All other systems reviewed and are otherwise negative except as noted above.  Physical Exam  Blood pressure 140/84, pulse 69, height 5\' 9"  (1.753 m), weight 237 lb 4 oz (107.616 kg).  General: Pleasant, NAD Psych: Normal affect. Neuro: Alert and oriented X 3. Moves all extremities spontaneously. HEENT: Normal  Neck: Supple without bruits or JVD. Lungs:  Resp regular and unlabored, CTA. Heart: RRR no s3, s4, or murmurs. Abdomen: Soft, non-tender, non-distended, BS + x 4.  Extremities: No clubbing, cyanosis or edema. DP/PT/Radials 2+ and equal bilaterally.  Accessory Clinical Findings  ECG - rsr, 69, right axis, lvh w/ inflat twi,  poor r prog   Assessment & Plan  1.  Acute Resp Failure:  S/p recent admission for what sounds predominantly to be a COPD flare.  Ss much improved with ongoing steroid taper.  Volume status looks good.  He needs to quit smoking - cessation advised.  2.  Chronic Diast CHF/H/o nonischemic tachy-mediated cardiomyopathy:  EF prev as low as 15% but has since normalized.  F/U echo as its been a year and there was some question of chf on recent admission.  He remains on bb, arb, lasix, and spiro.  F/U bmet today.  We discussed the importance of  daily weights, sodium restriction, med compliance, and Ss reporting.  3.  Ascending Ao Aneurysm:  He has been having annual MR Angio's - will order as he is overdue.  4.  HTN:  BP stable on multiple meds.  5. CKD II:  F/u bmet today.  Creat rose with diuresis during recent hospitalization.  6.  Tob Abuse:  Cessation strongly advised.  He is not motivated to quit.  7.  Dispo: f/u echo and MR angio.  F/U Dr. Aundra Dubin in 3 mos in [place].    Rogelia Mire, NP 11/08/2013, 3:29 PM

## 2013-11-08 NOTE — Patient Instructions (Addendum)
Your physician recommends that you have lab work today:  [facility]  Your physician recommends that you schedule a follow-up appointment in:  3 months with Dr. Aundra Dubin   Your physician has requested that you have an echocardiogram. Echocardiography is a painless test that uses sound waves to create images of your heart. It provides your doctor with information about the size and shape of your heart and how well your heart's chambers and valves are working. This procedure takes approximately one hour. There are no restrictions for this procedure.  Your physician has requested that you have an MR Angiogram at [facility] 11/17/13 at 3:45 PM  Please Enter in the [facility]  Please call [phone] if you need to reschedule

## 2013-11-09 LAB — BASIC METABOLIC PANEL
BUN/Creatinine Ratio: 18 (ref 10–22)
BUN: 28 mg/dL — AB (ref 8–27)
CO2: 24 mmol/L (ref 18–29)
Calcium: 9.7 mg/dL (ref 8.6–10.2)
Chloride: 101 mmol/L (ref 97–108)
Creatinine, Ser: 1.56 mg/dL — ABNORMAL HIGH (ref 0.76–1.27)
GFR, EST AFRICAN AMERICAN: 55 mL/min/{1.73_m2} — AB (ref >59)
GFR, EST NON AFRICAN AMERICAN: 47 mL/min/{1.73_m2} — AB (ref >59)
GLUCOSE: 85 mg/dL (ref 65–99)
Potassium: 5.1 mmol/L (ref 3.5–5.2)
Sodium: 145 mmol/L — ABNORMAL HIGH (ref 134–144)

## 2013-11-17 ENCOUNTER — Ambulatory Visit (HOSPITAL_COMMUNITY)
Admission: RE | Admit: 2013-11-17 | Discharge: 2013-11-17 | Disposition: A | Discharge status: Home / Self Care | Payer: Medicare Other | Source: Ambulatory Visit | Attending: Nurse Practitioner | Admitting: Nurse Practitioner

## 2013-11-17 ENCOUNTER — Telehealth: Payer: Self-pay | Admitting: *Deleted

## 2013-11-17 DIAGNOSIS — I712 Thoracic aortic aneurysm, without rupture, unspecified: Secondary | ICD-10-CM | POA: Insufficient documentation

## 2013-11-17 DIAGNOSIS — I509 Heart failure, unspecified: Secondary | ICD-10-CM

## 2013-11-17 DIAGNOSIS — I7121 Aneurysm of the ascending aorta, without rupture: Secondary | ICD-10-CM

## 2013-11-17 MED ORDER — GADOBENATE DIMEGLUMINE 529 MG/ML IV SOLN: 20.0000 mL | Freq: Once | INTRAVENOUS | Status: AC | PRN

## 2013-11-17 NOTE — Telephone Encounter (Signed)
Message copied by Tracie Harrier on Fri Nov 17, 2013  2:36 PM ------      Message from: Rogelia Mire      Created: Sat Nov 11, 2013 10:41 AM       His creat and K are slightly elevated.  Let's have him hold his spironolactone and f/u bmet on Thurs or Friday. ------

## 2013-11-20 ENCOUNTER — Ambulatory Visit (INDEPENDENT_AMBULATORY_CARE_PROVIDER_SITE_OTHER): Payer: Medicare Other | Admitting: *Deleted

## 2013-11-20 DIAGNOSIS — I509 Heart failure, unspecified: Secondary | ICD-10-CM

## 2013-11-21 ENCOUNTER — Other Ambulatory Visit (INDEPENDENT_AMBULATORY_CARE_PROVIDER_SITE_OTHER): Payer: Medicare Other

## 2013-11-21 ENCOUNTER — Telehealth: Payer: Self-pay | Admitting: *Deleted

## 2013-11-21 ENCOUNTER — Other Ambulatory Visit: Payer: Self-pay

## 2013-11-21 DIAGNOSIS — I712 Thoracic aortic aneurysm, without rupture, unspecified: Secondary | ICD-10-CM

## 2013-11-21 DIAGNOSIS — I509 Heart failure, unspecified: Secondary | ICD-10-CM

## 2013-11-21 DIAGNOSIS — I77810 Thoracic aortic ectasia: Secondary | ICD-10-CM

## 2013-11-21 DIAGNOSIS — I429 Cardiomyopathy, unspecified: Secondary | ICD-10-CM

## 2013-11-21 DIAGNOSIS — I2510 Atherosclerotic heart disease of native coronary artery without angina pectoris: Secondary | ICD-10-CM

## 2013-11-21 DIAGNOSIS — I428 Other cardiomyopathies: Secondary | ICD-10-CM

## 2013-11-21 LAB — BASIC METABOLIC PANEL
BUN/Creatinine Ratio: 8 — ABNORMAL LOW (ref 10–22)
BUN: 11 mg/dL (ref 8–27)
CO2: 23 mmol/L (ref 18–29)
Calcium: 9.2 mg/dL (ref 8.6–10.2)
Chloride: 102 mmol/L (ref 97–108)
Creatinine, Ser: 1.31 mg/dL — ABNORMAL HIGH (ref 0.76–1.27)
GFR, EST AFRICAN AMERICAN: 67 mL/min/{1.73_m2} (ref >59)
GFR, EST NON AFRICAN AMERICAN: 58 mL/min/{1.73_m2} — AB (ref >59)
GLUCOSE: 107 mg/dL — AB (ref 65–99)
Potassium: 4.1 mmol/L (ref 3.5–5.2)
Sodium: 140 mmol/L (ref 134–144)

## 2013-11-21 NOTE — Telephone Encounter (Signed)
Message copied by Tracie Harrier on Tue Nov 21, 2013  9:00 AM ------      Message from: Murray Hodgkins R      Created: Mon Nov 20, 2013  9:26 AM       Stable Aorta, no significant change - measuring 4.7cm @ the root (prev 4.6) and 4.3cm @ the ascending thoracic Ao (prev 4.2).  No dissection.  F/U MRI in 1 year. ------

## 2013-12-02 DIAGNOSIS — I10 Essential (primary) hypertension: Secondary | ICD-10-CM

## 2013-12-02 DIAGNOSIS — I712 Thoracic aortic aneurysm, without rupture, unspecified: Secondary | ICD-10-CM

## 2013-12-02 DIAGNOSIS — I509 Heart failure, unspecified: Secondary | ICD-10-CM

## 2013-12-02 DIAGNOSIS — I5032 Chronic diastolic (congestive) heart failure: Secondary | ICD-10-CM

## 2013-12-02 DIAGNOSIS — J9600 Acute respiratory failure, unspecified whether with hypoxia or hypercapnia: Secondary | ICD-10-CM

## 2013-12-02 DIAGNOSIS — I428 Other cardiomyopathies: Secondary | ICD-10-CM

## 2014-01-18 ENCOUNTER — Ambulatory Visit: Payer: Medicare Other | Admitting: Cardiology

## 2014-03-07 ENCOUNTER — Ambulatory Visit: Payer: Medicare Other | Admitting: Cardiology

## 2014-03-08 ENCOUNTER — Inpatient Hospital Stay: Payer: Self-pay | Admitting: Internal Medicine

## 2014-03-08 LAB — PRO B NATRIURETIC PEPTIDE: B-Type Natriuretic Peptide: 6842 pg/mL — ABNORMAL HIGH (ref 0–125)

## 2014-03-08 LAB — BASIC METABOLIC PANEL
Anion Gap: 7 (ref 7–16)
BUN: 14 mg/dL (ref 7–18)
Calcium, Total: 8.4 mg/dL — ABNORMAL LOW (ref 8.5–10.1)
Chloride: 105 mmol/L (ref 98–107)
Co2: 30 mmol/L (ref 21–32)
Creatinine: 1.67 mg/dL — ABNORMAL HIGH (ref 0.60–1.30)
EGFR (Non-African Amer.): 43 — ABNORMAL LOW
GFR CALC AF AMER: 50 — AB
GLUCOSE: 128 mg/dL — AB (ref 65–99)
OSMOLALITY: 285 (ref 275–301)
Potassium: 4.7 mmol/L (ref 3.5–5.1)
Sodium: 142 mmol/L (ref 136–145)

## 2014-03-08 LAB — CBC
HCT: 54.2 % — AB (ref 40.0–52.0)
HGB: 17.6 g/dL (ref 13.0–18.0)
MCH: 32.5 pg (ref 26.0–34.0)
MCHC: 32.4 g/dL (ref 32.0–36.0)
MCV: 100 fL (ref 80–100)
Platelet: 163 10*3/uL (ref 150–440)
RBC: 5.41 10*6/uL (ref 4.40–5.90)
RDW: 14.9 % — AB (ref 11.5–14.5)
WBC: 7.4 10*3/uL (ref 3.8–10.6)

## 2014-03-08 LAB — TROPONIN I
Troponin-I: 0.09 ng/mL — ABNORMAL HIGH
Troponin-I: 0.06 ng/mL — ABNORMAL HIGH

## 2014-03-09 LAB — CBC WITH DIFFERENTIAL/PLATELET
BASOS PCT: 0.4 %
Basophil #: 0 10*3/uL (ref 0.0–0.1)
EOS ABS: 0 10*3/uL (ref 0.0–0.7)
Eosinophil %: 0.1 %
HCT: 54.1 % — AB (ref 40.0–52.0)
HGB: 17.5 g/dL (ref 13.0–18.0)
LYMPHS ABS: 0.7 10*3/uL — AB (ref 1.0–3.6)
LYMPHS PCT: 11.7 %
MCH: 32.1 pg (ref 26.0–34.0)
MCHC: 32.4 g/dL (ref 32.0–36.0)
MCV: 99 fL (ref 80–100)
MONOS PCT: 1.4 %
Monocyte #: 0.1 x10 3/mm — ABNORMAL LOW (ref 0.2–1.0)
Neutrophil #: 5.4 10*3/uL (ref 1.4–6.5)
Neutrophil %: 86.4 %
Platelet: 176 10*3/uL (ref 150–440)
RBC: 5.47 10*6/uL (ref 4.40–5.90)
RDW: 14.8 % — AB (ref 11.5–14.5)
WBC: 6.3 10*3/uL (ref 3.8–10.6)

## 2014-03-09 LAB — BASIC METABOLIC PANEL
Anion Gap: 8 (ref 7–16)
BUN: 18 mg/dL (ref 7–18)
CO2: 32 mmol/L (ref 21–32)
CREATININE: 1.64 mg/dL — AB (ref 0.60–1.30)
Calcium, Total: 8.5 mg/dL (ref 8.5–10.1)
Chloride: 102 mmol/L (ref 98–107)
EGFR (African American): 51 — ABNORMAL LOW
GFR CALC NON AF AMER: 44 — AB
Glucose: 123 mg/dL — ABNORMAL HIGH (ref 65–99)
Osmolality: 286 (ref 275–301)
POTASSIUM: 4.7 mmol/L (ref 3.5–5.1)
Sodium: 142 mmol/L (ref 136–145)

## 2014-03-09 LAB — TROPONIN I: Troponin-I: 0.05 ng/mL

## 2014-03-10 LAB — BASIC METABOLIC PANEL
Anion Gap: 5 — ABNORMAL LOW (ref 7–16)
BUN: 27 mg/dL — ABNORMAL HIGH (ref 7–18)
CALCIUM: 8.4 mg/dL — AB (ref 8.5–10.1)
CO2: 34 mmol/L — AB (ref 21–32)
Chloride: 101 mmol/L (ref 98–107)
Creatinine: 1.58 mg/dL — ABNORMAL HIGH (ref 0.60–1.30)
EGFR (Non-African Amer.): 46 — ABNORMAL LOW
GFR CALC AF AMER: 54 — AB
Glucose: 93 mg/dL (ref 65–99)
OSMOLALITY: 284 (ref 275–301)
Potassium: 4.2 mmol/L (ref 3.5–5.1)
Sodium: 140 mmol/L (ref 136–145)

## 2014-03-10 LAB — MAGNESIUM: MAGNESIUM: 2 mg/dL

## 2014-03-11 LAB — BASIC METABOLIC PANEL
Anion Gap: 4 — ABNORMAL LOW (ref 7–16)
BUN: 23 mg/dL — ABNORMAL HIGH (ref 7–18)
CALCIUM: 8.4 mg/dL — AB (ref 8.5–10.1)
CHLORIDE: 100 mmol/L (ref 98–107)
CREATININE: 1.45 mg/dL — AB (ref 0.60–1.30)
Co2: 36 mmol/L — ABNORMAL HIGH (ref 21–32)
EGFR (Non-African Amer.): 51 — ABNORMAL LOW
GFR CALC AF AMER: 59 — AB
GLUCOSE: 91 mg/dL (ref 65–99)
Osmolality: 283 (ref 275–301)
POTASSIUM: 4.3 mmol/L (ref 3.5–5.1)
Sodium: 140 mmol/L (ref 136–145)

## 2014-03-12 LAB — MAGNESIUM: Magnesium: 1.8 mg/dL

## 2014-03-13 LAB — CULTURE, BLOOD (SINGLE)

## 2014-03-16 ENCOUNTER — Ambulatory Visit: Payer: Medicare Other | Admitting: Cardiology

## 2014-04-11 ENCOUNTER — Ambulatory Visit: Payer: Self-pay | Admitting: Family

## 2014-05-17 ENCOUNTER — Ambulatory Visit: Payer: Self-pay | Admitting: Family

## 2014-05-22 ENCOUNTER — Telehealth: Payer: Self-pay | Admitting: Cardiology

## 2014-05-22 NOTE — Telephone Encounter (Signed)
Received 7 pages from [facility], sent to Dr. Aundra Dubin. 05/22/14/ss

## 2014-05-31 ENCOUNTER — Ambulatory Visit: Payer: Self-pay | Admitting: Family

## 2014-06-07 ENCOUNTER — Ambulatory Visit: Payer: Self-pay | Admitting: Family

## 2014-08-16 ENCOUNTER — Ambulatory Visit: Payer: Self-pay | Admitting: Family

## 2014-08-17 ENCOUNTER — Ambulatory Visit: Payer: Self-pay | Admitting: Family

## 2014-08-30 ENCOUNTER — Ambulatory Visit: Payer: Self-pay | Admitting: Family

## 2014-09-05 ENCOUNTER — Ambulatory Visit: Payer: Self-pay | Admitting: Family

## 2014-10-16 ENCOUNTER — Ambulatory Visit: Payer: Self-pay | Admitting: Family

## 2014-10-29 ENCOUNTER — Ambulatory Visit: Admit: 2014-10-29 | Disposition: A | Discharge status: Home / Self Care | Payer: Self-pay | Admitting: Family

## 2014-11-10 NOTE — Discharge Summary (Signed)
PATIENT NAME:  Bobby Gray, Bobby Gray MR#:  [id number] DATE OF BIRTH:  05/25/1951  DATE OF ADMISSION:  11/01/2013 DATE OF DISCHARGE:  11/03/2013  ADMITTING DIAGNOSIS: Congestive heart failure.   DISCHARGE DIAGNOSES:  1. Acute respiratory failure.  2. Acute on chronic systolic congestive heart failure exacerbation.  3. Chronic obstructive pulmonary disease exacerbation.  4. Acute bronchitis.  5. Malignant hypertension. 6. Medical noncompliance.  7. Chronic kidney disease.  8. Erythrocytosis of unclear etiology.  9. Tobacco abuse.  10. History of coronary artery disease. 11. Cardiomyopathy with ejection fraction of 25%.  12. Hypertension.  13. Hyperlipidemia.  14. Paroxysmal atrial fibrillation, in sinus rhythm at the present.   DISCHARGE CONDITION: Stable.   DISCHARGE MEDICATIONS: 1.  The patient is to continue Levaquin 750 mg p.o. once daily for 4 days, prednisone 50 mg p.o. once on 11/04/2013 then taper by 10 mg daily until stopped.  1. Valsartan 320 mg p.o. daily.  2. Crestor 10 mg p.o. daily.  3. Aspirin 81 mg per day.  4. Coreg 25 mg p.o. twice daily.  5. Advair Diskus 750/50 one puff twice daily.  6. Tiotropium 1 capsule inhalation once daily.  7. Combivent Respimat 1 puff 4 times daily as needed.  8. Amlodipine 10 mg p.o. daily.  9. Nicotine inhalation device 10 mg inhaler every 1 to 2 hours as needed.  10. Nicotine transdermal patch 14 mg topically daily.  11. Furosemide 40 mg p.o. daily.  The patient is not to take Coreg CR, albuterol inhaler, or Avapro unless recommended by primary care physician.   HOME OXYGEN: None.   DIET: 2 grams salt for hypo cholesterol, regular consistency.   ACTIVITY LIMITATIONS: As tolerated.  FOLLOWUP APPOINTMENTS: With [facility] in 2 days after discharge, Dr. Franchot Mimes in 2 days after discharge. The patient was advised also to have his BMP checked in 2 days after discharge at his PCP office or cardiology office.   CONSULTANTS: Case  management and social work.   RADIOLOGIC STUDIES: Chest x-ray, portable single view, the 11/01/2013 showing mild pulmonary edema. Repeated chest x-ray PA and lateral, 11/01/2013, revealed pulmonary vascularity being normal and chronic cardiomegaly. The patient is a 64 year old male with history of cardiomyopathy with ejection fraction of 25% who presents to the hospital on 11/01/2013 with complaints of shortness of breath as well as orthopnea. Please refer to Dr. Boykin Reaper admission note on 11/01/2013.   PHYSICAL EXAMINATION: VITALS SIGNS: On arrival to the hospital, the patient's vitals: Temperature was 98.7, pulse was 78, respiration rate was not measured. Blood pressure 189/134, saturation was 87% on room air and 90% on 1 liter of oxygen through nasal cannula.  RESPIRATORY: Physical exam revealed bilateral basal crackles as well as diffuse wheezing on both sides and use of accessory muscles.   Otherwise, physical exam was unremarkable.   LABORATORY DATA: The patient's EKG showed sinus rhythm with PACs, LVH, as well as left atrial dilatation.  The patient's lab data revealed an elevation of beta-type natriuretic peptide of 3353. The patient's BMP otherwise showed a creatinine of 1.34, otherwise BMP was unremarkable. Potassium level was 4.2. Magnesium level was 1.7 cardiac enzymes x 3 were within normal limits. The patient's white blood cell count was 8.1, hemoglobin was 18.6, and platelet count was 168. The patient's chest x-ray revealed mild congestive heart failure. The patient was admitted to the hospital for further evaluation with diagnosis of congestive heart failure due to CHF, exacerbation as well as likely COPD exacerbation. The patient was initiated on  steroids, inhalation therapy for COPD exacerbation as well as antibiotics for suspected bronchitis and diuretics for his congestive heart failure. His medications were also changed around since it appears that the patient was not taking his  medications adequately. He was initiated on high doses of valsartan and Avapro was stopped. With this therapy, he significantly improved. By the day of discharge, he felt satisfactory, was deemed off oxygen and his oxygen saturation was satisfactory. His physical examination, also showed significant improvement as well as his chest x-ray. He was recommended to continue current therapy and follow up with his primary care physician for further evaluation. He was, however, over diuresed and his creatinine worsened. On the day of admission, the patient's creatinine was 1.34. It went up to 1.74 on 08/05/2013, early in the morning. The patient's diuretic was stopped at that point and the patient's creatinine improved to 1.6 by the afternoon on the 11/03/2013. It is recommended to follow the patient's creatinine levels to make sure there the higher doses of ARB medication, valsartan, as well as diuretics so his kidney function does not worsen.  In regards to malignant hypertension, the patient's blood pressure was much better controlled on the day of discharge.  VITALS SIGNS: On 11/03/2013,  temperature was 98.1, pulse was 67, respiratory rate was 18, blood pressure 140/93, saturation was 93% on room air at rest.   It is recommended to add additional medications if needed for high blood pressure control for renal insufficiency. The patient is being does have history of chronic renal insufficiency which worsened. His diuretics and it is recommended to follow the patient's kidney function is now since he is on March high doses of angiotensin receptor blockers as well as diuretics, which can worsen his kidney function. The patient was noted to have erythrocytosis on admission to the hospital. The patient's hemoglobin level was found to be high at 18.6 with a hematocrit level of 57.1 on the day of admission, 11/01/2013. It remained somewhat high at 17.2 by the 11/03/2013. It remains unclear why the patient has  erythrocytosis; however, it was postulated that he may have underlying hypoxia and we recommend to have a nocturnal oximetry study done for him as outpatient to make sure the patient is not hypoxic at nighttime and supplemented his oxygen if needed if that is the reason for his erythrocytosis. If, however, further investigation fails to reveal any hypoxia, we would recommended patient to be seen by a hematologist to evaluate him from primary erythrocytosis.  Regarding tobacco abuse. The patient was counseled, and nicotine replacement therapy was initiated.  For history of coronary artery disease, the patient is to continue his outpatient management. The patient is being discharged in stable condition with the above-mentioned medications and followup.   TIME SPENT: 40 minutes on this patient.    ____________________________ Theodoro Grist, MD rv:lt D: 11/03/2013 18:56:01 ET T: 11/04/2013 01:07:14 ET JOB#: [id number]  cc: Theodoro Grist, MD, <Dictator> [place] [facility] [place]. Franchot Mimes, MD Hadlie Gipson MD ELECTRONICALLY SIGNED 11/18/2013 18:22

## 2014-11-10 NOTE — Discharge Summary (Signed)
PATIENT NAME:  Bobby Gray, Bobby Gray MR#:  [id number] DATE OF BIRTH:  02-10-1951  DATE OF ADMISSION:  03/08/2014 DATE OF DISCHARGE:  03/12/2014  ADMITTING PHYSICIAN: Abel Presto, MD   DISCHARGE PHYSICIAN: Bobby Maxon. Bobby Miles, MD   DISCHARGE DIAGNOSES:  1.  Acute on chronic systolic congestive heart failure.  2.  Proximal atrial fibrillation, new onset.   IMAGING: Two-dimensional echocardiogram.   CONSULTATIONS: Cardiology.   PROCEDURES: None.   HISTORY: The patient was admitted through the Emergency Room due to shortness of breath, worsening orthopnea, weight gain with increased peripheral edema. Please refer to the history and physical for full details. The patient was admitted to a monitored bed. He was ruled out for acute coronary syndrome by cycled cardiac enzymes. The patient admitted to medication and dietary noncompliance as the reason for his decompensated congestive heart failure. He was diuresed with intravenous furosemide with quite brisk diuresis and which resulted in about 5 to 10 pounds of weight loss. The patient'Bobby Gray hospital stay was complicated by episodes of proximal atrial fibrillation and flutter with rapid ventricular response which resolved spontaneously. Cardiology was consulted. He was placed on beta blockers, specifically metoprolol. His stroke prophylaxis limited to oral aspirin and cardiology will follow up with him as an outpatient to determine further possible stroke prophylaxis, cardioversion and other treatment modalities.   DISPOSITION: The patient is discharged to home in satisfactory condition.   DISCHARGE MEDICATIONS:  1.  Valsartan 320 mg daily.  2.  Aspirin 81 mg daily. 3.  Carvedilol 25 mg b.i.d.  4.  Combivent Respimat  1 puff 4 times daily.  5.  Amlodipine 10 mg daily.  6.  Advair Diskus 250/50 b.i.d. inhaled.  7.  Furosemide 40 mg daily. 8.  Hydralazine 50 mg t.i.d.  9.  Crestor 20 mg in the evening.   DIET: Low sodium.   ACTIVITY: As tolerated.    FOLLOWUP: In 1-2 weeks with Dr. Elijio Gray and with his cardiologist.   DISCHARGE TIME SPENT: Thirty-five minutes.    ____________________________ Bobby Maxon Bobby Miles, MD sat:TT D: 03/27/2014 13:58:31 ET T: 03/27/2014 14:39:03 ET JOB#: [id number]  cc: Bobby A. Bobby Miles, MD, <Dictator> Veverly Fells MD ELECTRONICALLY SIGNED 03/28/2014 13:36

## 2014-11-10 NOTE — Consult Note (Signed)
PATIENT NAME:  Bobby Gray, Bobby Gray MR#:  [id number] DATE OF BIRTH:  1951/06/03  DATE OF CONSULTATION:  03/12/2014  CONSULTING PHYSICIAN:  Dionisio David, MD   INDICATION FOR CONSULTATION: Congestive heart failure and atrial flutter.  HISTORY OF PRESENT ILLNESS:  This is a 64 year old African American male with a past medical history of congestive heart failure, presented to the hospital with severe shortness of breath, orthopnea, leg swelling, and paroxysmal nocturnal dyspnea.  He also gained about 10 pounds in the past 10 days.  In the Emergency Room, he was given Lasix and was admitted to the hospital.   PAST MEDICAL HISTORY: History of cardiomyopathy with ejection fraction of 04% and systolic heart failure, hypertension, hyperlipidemia, COPD.     SOCIAL HISTORY: Smokes about 5 cigarettes a day. He has been smoking for 20 to 30 years. No EtOH abuse.   FAMILY HISTORY: His father and mother are deceased, but his brother had heart failure.   HOME MEDICATIONS: Advair, Coreg, Combivent inhaler, Crestor, hydralazine, Valsartan, and amlodipine.   PHYSICAL EXAMINATION:  GENERAL: He is alert and oriented in no acute distress right now.  VITAL SIGNS: Blood pressure is 107/67, respirations 18, pulse 55, temperature is 97.4, saturation 94%.  NECK: 7 cm JVD.  LUNGS: There is good air entry. No rales, no rhonchi. HEART: Regular rate and rhythm normal, S1, S2. No audible murmur.  ABDOMEN: Soft, nontender, positive bowel sounds.  EXTREMITIES: No pedal edema.  NEUROLOGIC: The patient appears to be intact.   LABORATORY AND DIAGNOSTICS: His EKG shows sinus rhythm, 76 beats per minute, nonspecific ST-T changes.  Monitor shows sinus bradycardia about 56 beats per minute.  He already had atrial flutter and 7 beat run of nonsustained ventricular tachycardia. His laboratory data this morning, he had a magnesium checked that was normal at 1.8. BUN was 23, creatinine is 1.45, glucose is 91, chloride 100 and osmolarity  is 283. GFR is a 59.  His white count is 6.5. Hemoglobin is 17.5, platelet count is 176.  He had a chest x-ray done which showed congestion with changes suggestive of congestive heart failure with vascular congestion and interstitial pulmonary edema.  His troponins were done initially when he was admitted and it was just mildly elevated at 0.06, only 1 of them.  BNP, however, was 6842   ASSESSMENT AND PLAN: The patient has decompensated congestive heart failure when he presented to the Emergency Room.  Right now it appears to be compensated, hemodynamically stable.  He is feeling much better. He did have a run of nonsustained ventricular tachycardia and atrial flutter but currently is back in sinus rhythm. He is taking carvedilol 25 mg b.i.d., which is a maximum dose along with aspirin, amlodipine, hydralazine, furosemide and valsartan . Paroxysmal atrial flutter and nonsustained ventricular tachycardia in the setting of severe LV dysfunction is not uncommon.  Given his renal insufficiency, another option is to add digoxin, but his heart rate is right now kind of low.  I agree with continuing current medications. No need to anticoagulate right now.  Agree with getting an echocardiogram to look at his current ejection fraction.  Continue Lasix for now and may add Aldactone once creatinine is better.      ____________________________ Dionisio David, MD sak:DT D: 03/12/2014 14:13:53 ET T: 03/12/2014 15:44:11 ET JOB#: [id number]  cc: Dionisio David, MD, <Dictator> Dionisio David MD ELECTRONICALLY SIGNED 04/05/2014 13:29

## 2014-11-10 NOTE — H&P (Signed)
PATIENT NAME:  Bobby Gray, Bobby Gray MR#:  [id number] DATE OF BIRTH:  June 15, 1951  DATE OF ADMISSION:  11/01/2013  PRIMARY CARE PHYSICIAN:  Marwan T. Powers, MD, at [facility] primary care.   PRIMARY CARDIOLOGIST: [facility], the patient does not remember the name.   CHIEF COMPLAINT: Shortness of breath with orthopnea.   HISTORY OF PRESENT ILLNESS: A 64 year old African American male patient with history of coronary artery disease, congestive heart failure with ejection fraction of 25%, hypertension, tobacco abuse, atrial fibrillation who presents to the Emergency Room complaining of shortness of breath, dizziness, orthopnea. The patient has not taken his medications for the past 2 weeks, including blood pressure pills or Lasix. Has been sleeping in a recliner or with head propped up with pillows. He also felt dizzy earlier today, had some mild left-sided chest pain lasting about a few seconds. Here in the Emergency Room, the patient has been found to have saturations of 87% on room air and 90% on 1 liter oxygen with orthopnea and is being admitted to the hospitalist service. Chest x-ray shows mild pulmonary edema. The patient mentions that he ran out of his medications but he does seem to have a history of noncompliance.   PAST MEDICAL HISTORY: 1.  Coronary artery disease.  2.  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  3.  Hypertension.  4.  Hyperlipidemia.  5.  Tobacco abuse.  6.  Paroxysmal atrial fibrillation, was on Coumadin in the past.   SOCIAL HISTORY: The patient continues to smoke pack a day. Rare alcohol use. No drug use. Lives at home alone.   FAMILY HISTORY: His brother has coronary artery disease and pacemaker.   ALLERGIES: No known drug allergies.   HOME MEDICATIONS: 1.  Tylenol 500 mg every 6 hours as needed.  2.  Amlodipine 10 mg daily.  3.  Aspirin 81 mg daily.  4.  Coreg 40 mg 2 times a day.  5.  Advair 250/50 one puff  b.i.d.  6.  Lasix 40 mg 1/2 tablet daily.  7.  Hydralazine 50 mg 3 times a day.  8.  Nitroglycerin 0.4 sublingual as needed for chest pain.  9.  Omega-3 fatty acids 1000 mg 2 times daily.  10.  Crestor 20 mg daily.  11.  Spironolactone 25 mg 1/2 tablet daily.  12.  Spiriva 18 mcg 1 capsule daily.  13.  Valsartan 320 mg daily.   REVIEW OF SYSTEMS:    CONSTITUTIONAL: Complains of some fatigue, weight gain.  EYES: No blurred vision, pain or redness.  ENT: No tinnitus, ear pain, hearing loss.  RESPIRATORY: Has cough, wheezing,  CARDIOVASCULAR: Had left-sided chest pain and orthopnea.  GASTROINTESTINAL: No nausea, vomiting, diarrhea, abdominal pain.  GENITOURINARY: No dysuria, hematuria, frequency.  ENDOCRINE: No polyuria, nocturia, thyroid problems.  HEMATOLOGY AND LYMPHATIC: No anemia, easy bruising, bleeding.  INTEGUMENTARY: No acne, rash, lesion.  MUSCULOSKELETAL: No back pain, arthritis.  NEUROLOGIC: No focal numbness, weakness, seizure.  PSYCHIATRIC: No anxiety or depression.   PHYSICAL EXAMINATION: VITAL SIGNS: Temperature 98.7, pulse of 78, blood pressure 189/134, saturating 87% on room air, 90% on 1 liter oxygen.  GENERAL: Obese African American male patient lying in bed propped up with mild respiratory distress.  PSYCHIATRIC:  Alert and oriented x 3. Mood and affect appropriate. Judgment intact.  HEENT: Atraumatic, normocephalic. Oral mucosa moist and pink. Ears and nose normal. No pallor. No icterus. Pupils are equal and reactive to light.  NECK: Supple. No thyromegaly or  palpable lymph nodes. Trachea midline. No carotid bruit or JVD.  CARDIOVASCULAR: S1, S2, without any murmurs. Peripheral pulses 2+.  RESPIRATORY: Has use of accessory muscles, has bilateral basal crackles and diffuse wheezing on both sides.  GASTROINTESTINAL: Soft abdomen, nontender. Bowel sounds present. No organomegaly palpable.  SKIN: Warm and dry. No petechiae.  MUSCULOSKELETAL: No joint swelling,  redness of the large joints. Normal muscle tone.  NEUROLOGICAL: Motor strength 5/5 in upper and lower extremities. Sensation is intact all over.  LYMPHATIC: No cervical lymphadenopathy.   LABORATORY STUDIES:  1.  Show chest x-ray with pulmonary edema, no infiltrates or effusion.  2.  Glucose 98, BNP of 3353. BUN 13, creatinine 1.34, sodium 138, potassium 4.2, troponin 0.03.  3.  WBC 8.1, hemoglobin 18.6, platelets of 168.  4.  EKG shows normal sinus rhythm with PACs, also left ventricular hypertrophy along with left atrial dilation.   ASSESSMENT AND PLAN: 1.  Acute respiratory failure secondary to acute on chronic systolic congestive heart failure and acute chronic obstructive pulmonary disease exacerbation. We will start patient on IV Lasix, monitor ins and outs, fluid restriction, low-salt diet. I have talked to patient regarding dietary compliance. He does add extra salt and has been eating bacon, sausage and has not taken his Lasix or blood pressure pills, which has caused his acute on chronic systolic congestive heart failure. Counseled patient.  2.  Chronic obstructive pulmonary disease exacerbation.  Start a nebulizer, antibiotics and intravenous steroids. Nebulizers scheduled.  3.  Accelerated hypertension causing decompensation of congestive heart failure. Restart his Coreg, losartan and amlodipine. Can add the rest of the medications later.  4.  Paroxysmal atrial fibrillation, presently in normal sinus rhythm. Not on any anticoagulation. Monitor on telemetry.  5.  Tobacco abuse. Counseled patient to quit smoking for greater than 3 minutes.  6.  Chronic kidney disease 3. Baseline creatinine seems to be around 1.3 to 1.4, stable.  7.  Deep vein thrombosis prophylaxis with Lovenox.  8.  CODE STATUS: Full code.   TIME SPENT TODAY ON THIS CASE: Was 40 minutes.    ____________________________ Leia Alf Taleia Sadowski, MD srs:cs D: 11/01/2013 18:20:07 ET T: 11/01/2013 18:51:04  ET JOB#: [id number]  cc: Alveta Heimlich R. Othel Hoogendoorn, MD, <Dictator> Neita Carp MD ELECTRONICALLY SIGNED 11/03/2013 12:51

## 2014-11-10 NOTE — H&P (Signed)
PATIENT NAME:  Bobby Gray, Bobby Gray MR#:  [id number] DATE OF BIRTH:  08-11-1950  DATE OF ADMISSION:  03/08/2014  PRIMARY CARE PHYSICIAN: Dr. Mathis Bud.    CHIEF COMPLAINT: Shortness of breath.   HISTORY OF PRESENT ILLNESS: This is a 64 year old male who presents to the hospital complaining of shortness of breath that began for the past couple of days. He describes the shortness of breath mostly with exertion. He has chronic 2-3 pillow orthopnea. He does say that he has gained about 5 or 10 pounds in the past week to 10 days. Since his shortness of breath was not improving, he came to the ER for further evaluation. The patient was noted to be in congestive heart failure. Hospitalist services were contacted for further treatment and evaluation. The patient denied any chest pain, any nausea, any vomiting, abdominal pain, diarrhea, fevers, chills, cough, any other associated symptoms presently.   REVIEW OF SYSTEMS:  CONSTITUTIONAL: No documented fever. No weight gain, no weight loss.  EYES: No blurry or double vision.  EARS, NOSE AND THROAT: No tinnitus. No postnasal drip. No redness of the oropharynx.  RESPIRATORY: No cough. No wheeze. No hemoptysis. Positive dyspnea.  CARDIOVASCULAR: No chest pain. Positive baseline 2-3 pillow orthopnea. No palpitations. No syncope.  GASTROINTESTINAL: No nausea, no vomiting, diarrhea. No abdominal pain. No melena or hematochezia.  GENITOURINARY: No dysuria or hematuria.  ENDOCRINE: No polyuria, nocturia, heat or cold intolerance. HEMATOLOGIC: No anemia. No bruising, no bleeding.  INTEGUMENTARY: No rashes. No lesions.  MUSCULOSKELETAL: No arthritis, no swelling, no gout.  NEUROLOGIC: No numbness. No tingling. No ataxia. No seizure-type activity.  PSYCHIATRIC: No anxiety, no insomnia. No ADD.   PAST MEDICAL HISTORY: Consistent with cardiomyopathy, ejection fraction of 25%, history of systolic CHF, hyperlipidemia, hypertension, COPD with ongoing tobacco abuse.    ALLERGIES: NO KNOWN DRUG ALLERGIES.   SOCIAL HISTORY: Still smokes about 5 cigarettes per day, has been smoking for the past 20- 30 years. No alcohol abuse. No illicit drug abuse. Lives at home with his wife.   FAMILY HISTORY: Mother and father are both deceased. He cannot recall what his father died from. Mother died from complications of a brain aneurysm.   CURRENT MEDICATIONS: Advair 250/50 one puff b.i.d., amlodipine 10 mg daily, aspirin 81 mg daily, Coreg 25 mg b.i.d., Combivent 1 puff q.i.d. as needed, Crestor 20 mg daily, Lasix 40 mg daily, hydralazine 50 mg t.i.d. and valsartan 320 mg daily.   PHYSICAL EXAMINATION: Presently is as follows:  VITAL SIGNS: Noted to be temperature 97.9, pulse 98, respirations 20, blood pressure 179/134, pulse oximetry 93% on 2 liters nasal cannula.  GENERAL: He is a pleasant-appearing male in no apparent distress.  HEAD, EYES, EARS, NOSE, THROAT: Atraumatic, normocephalic. Extraocular muscles are intact. Pupils equal and reactive to light. Sclerae anicteric. No conjunctival injection. No pharyngeal erythema. NECK: Supple. No jugular venous distention. No bruits. No lymphadenopathy. No thyromegaly.  HEART: Regular rate and rhythm. No murmurs. No rubs. No clicks.  LUNGS: He has some bibasilar crackles. Negative use of accessory muscles. No dullness to percussion. No rhonchi, no wheezing.  ABDOMEN: Soft, flat, nontender, nondistended. He has good bowel sounds. No hepatosplenomegaly appreciated.  EXTREMITIES: No cyanosis, clubbing. Does have +1 pitting edema from the knees to ankles bilaterally, +2 pedal and radial pulses bilaterally.  NEUROLOGICAL: The patient is alert, awake, and oriented x 3 with no focal motor or sensory deficits appreciated bilaterally.  SKIN: Moist and warm with no rashes appreciated.  LYMPHATIC: There is no  cervical or axillary lymphadenopathy.   LABORATORY EXAMINATION: Showed a serum glucose of 128, BUN 14, creatinine 1.6, sodium  142, potassium 4.7, chloride 105, bicarbonate 30. Troponin 0.09. White cell count 7.4, hemoglobin 78.6, hematocrit 54.2, platelet count of 163,000. The patient did have a chest x-ray done which showed congestive heart failure.   ASSESSMENT AND PLAN: This is a 64 year old male with a history of hypertension, chronic obstructive pulmonary disease, cardiomyopathy, ejection fraction of 25%, history of systolic congestive heart failure, hyperlipidemia who presents to the hospital with shortness of breath and noted to be in decompensated congestive heart failure. 1.  Congestive heart failure. This is likely acute on chronic systolic dysfunction. The patient apparently had cut down on his Lasix dose at home and was also not following a strict low salt diet, likely leading to his worsening congestive heart failure. I will diurese him with IV Lasix, follow I's and O's and daily weights, continue his beta blocker and losartan for now.  2.  Elevated troponin. This is likely in the setting of demand ischemia from hypoxemia and congestive heart failure. I will place him on telemetry, cycle his markers. Continue aspirin, beta blocker and statin.  3.  Chronic obstructive pulmonary disease, no acute exacerbation. Continue his Advair and Combivent.  4.  Hyperlipidemia. Continue Crestor.   CODE STATUS: The patient is a full code.   TIME SPENT: Fifty minutes.    ____________________________ Belia Heman. Verdell Carmine, MD vjs:TT D: 03/08/2014 20:13:50 ET T: 03/08/2014 21:20:03 ET JOB#: [id number]  cc: Belia Heman. Verdell Carmine, MD, <Dictator> Henreitta Leber MD ELECTRONICALLY SIGNED 03/17/2014 11:52

## 2014-11-19 ENCOUNTER — Encounter (INDEPENDENT_AMBULATORY_CARE_PROVIDER_SITE_OTHER): Payer: Self-pay

## 2014-11-19 ENCOUNTER — Encounter: Payer: Self-pay | Admitting: Family

## 2014-11-19 ENCOUNTER — Ambulatory Visit: Payer: Medicare HMO | Attending: Family | Admitting: Family

## 2014-11-19 VITALS — BP 112/73 | HR 68 | Resp 20 | Ht 69.0 in | Wt 219.0 lb

## 2014-11-19 DIAGNOSIS — I509 Heart failure, unspecified: Secondary | ICD-10-CM | POA: Diagnosis present

## 2014-11-19 DIAGNOSIS — J410 Simple chronic bronchitis: Secondary | ICD-10-CM

## 2014-11-19 DIAGNOSIS — J449 Chronic obstructive pulmonary disease, unspecified: Secondary | ICD-10-CM | POA: Insufficient documentation

## 2014-11-19 DIAGNOSIS — F17210 Nicotine dependence, cigarettes, uncomplicated: Secondary | ICD-10-CM | POA: Insufficient documentation

## 2014-11-19 DIAGNOSIS — R0602 Shortness of breath: Secondary | ICD-10-CM | POA: Insufficient documentation

## 2014-11-19 DIAGNOSIS — I10 Essential (primary) hypertension: Secondary | ICD-10-CM | POA: Insufficient documentation

## 2014-11-19 DIAGNOSIS — I5022 Chronic systolic (congestive) heart failure: Secondary | ICD-10-CM

## 2014-11-19 MED ORDER — SACUBITRIL-VALSARTAN 24-26 MG PO TABS: 1.0000 | ORAL_TABLET | Freq: Two times a day (BID) | ORAL | Status: DC

## 2014-11-19 NOTE — Progress Notes (Signed)
   Subjective:    Patient ID: Bobby Gray, male    DOB: 1951-06-01, 64 y.o.   MRN: [id number]  Shortness of Breath This is a chronic problem. The current episode started more than 1 year ago. The problem occurs daily. The problem has been gradually improving. Associated symptoms include sputum production. Pertinent negatives include no chest pain, leg swelling, orthopnea, PND, rash, sore throat or wheezing. The symptoms are aggravated by exercise. Risk factors include smoking. He has tried ipratropium inhalers for the symptoms. The treatment provided moderate relief. His past medical history is significant for COPD and a heart failure.      Review of Systems  Constitutional: Positive for fatigue.  HENT: Positive for congestion. Negative for sore throat.   Eyes: Negative for redness and visual disturbance.  Respiratory: Positive for cough, sputum production and shortness of breath. Negative for wheezing.   Cardiovascular: Negative for chest pain, orthopnea, leg swelling and PND.  Gastrointestinal: Negative for constipation and abdominal distention.  Endocrine: Negative.   Genitourinary: Negative.   Musculoskeletal: Positive for arthralgias (left ankle pain).  Skin: Negative for rash.  Allergic/Immunologic: Negative.   Neurological: Negative for dizziness, light-headedness and numbness.  Hematological: Negative.   Psychiatric/Behavioral: Negative.        Objective:   Physical Exam  Constitutional: He is oriented to person, place, and time. He appears well-developed and well-nourished.  HENT:  Head: Normocephalic.  Mouth/Throat: No oropharyngeal exudate.  Eyes: Pupils are equal, round, and reactive to light. Left eye exhibits no discharge.  Neck: Normal range of motion. Neck supple.  Cardiovascular: Normal rate and normal heart sounds.   Pulmonary/Chest: Effort normal and breath sounds normal.  Abdominal: Soft. He exhibits no distension.  Musculoskeletal: Normal range of motion.  He exhibits no edema or tenderness.  Neurological: He is alert and oriented to person, place, and time.  Skin: Skin is warm and dry. No rash noted.  Psychiatric: He has a normal mood and affect. His behavior is normal.  Vitals reviewed.         Assessment & Plan:  Chronic heart failure with reduced ejection fraction: Patient was given 1 week's sample of Entresto 24/26mg  to take one tablet twice daily until his pharmacy gets his prescription in. Patient instructed to continue weighing daily and to call for an overnight weight gain of >2 pounds or a weekly weight gain of >5 pounds.   COPD: Patient says that his breathing is improved with very little cough. Continues with his inhalers at this time.   HTN: Blood pressure looks good today. Could consider stopping amlodipine and uptitrating other medications   Tobacco: Patient continues to smoke but says that it's only about 1 cigarette daily. Encouraged complete cessation.   Return in 3 months or sooner for any questions/problems before then.

## 2014-11-20 ENCOUNTER — Ambulatory Visit: Payer: Self-pay | Admitting: Family

## 2015-02-19 ENCOUNTER — Ambulatory Visit: Payer: Medicare HMO | Admitting: Family

## 2015-02-20 ENCOUNTER — Ambulatory Visit: Payer: Medicare HMO | Attending: Family | Admitting: Family

## 2015-02-20 ENCOUNTER — Encounter: Payer: Self-pay | Admitting: Family

## 2015-02-20 VITALS — BP 157/92 | HR 71 | Resp 20 | Ht 69.0 in | Wt 230.0 lb

## 2015-02-20 DIAGNOSIS — I5032 Chronic diastolic (congestive) heart failure: Secondary | ICD-10-CM | POA: Insufficient documentation

## 2015-02-20 DIAGNOSIS — I4892 Unspecified atrial flutter: Secondary | ICD-10-CM | POA: Insufficient documentation

## 2015-02-20 DIAGNOSIS — F17210 Nicotine dependence, cigarettes, uncomplicated: Secondary | ICD-10-CM | POA: Insufficient documentation

## 2015-02-20 DIAGNOSIS — I712 Thoracic aortic aneurysm, without rupture: Secondary | ICD-10-CM | POA: Diagnosis not present

## 2015-02-20 DIAGNOSIS — I351 Nonrheumatic aortic (valve) insufficiency: Secondary | ICD-10-CM | POA: Insufficient documentation

## 2015-02-20 DIAGNOSIS — F17200 Nicotine dependence, unspecified, uncomplicated: Secondary | ICD-10-CM

## 2015-02-20 DIAGNOSIS — I429 Cardiomyopathy, unspecified: Secondary | ICD-10-CM | POA: Diagnosis not present

## 2015-02-20 DIAGNOSIS — I10 Essential (primary) hypertension: Secondary | ICD-10-CM

## 2015-02-20 DIAGNOSIS — N182 Chronic kidney disease, stage 2 (mild): Secondary | ICD-10-CM | POA: Diagnosis not present

## 2015-02-20 DIAGNOSIS — I2510 Atherosclerotic heart disease of native coronary artery without angina pectoris: Secondary | ICD-10-CM | POA: Diagnosis not present

## 2015-02-20 DIAGNOSIS — J449 Chronic obstructive pulmonary disease, unspecified: Secondary | ICD-10-CM | POA: Insufficient documentation

## 2015-02-20 DIAGNOSIS — E785 Hyperlipidemia, unspecified: Secondary | ICD-10-CM | POA: Insufficient documentation

## 2015-02-20 DIAGNOSIS — I129 Hypertensive chronic kidney disease with stage 1 through stage 4 chronic kidney disease, or unspecified chronic kidney disease: Secondary | ICD-10-CM | POA: Diagnosis not present

## 2015-02-20 DIAGNOSIS — I5022 Chronic systolic (congestive) heart failure: Secondary | ICD-10-CM

## 2015-02-20 DIAGNOSIS — Z79899 Other long term (current) drug therapy: Secondary | ICD-10-CM | POA: Insufficient documentation

## 2015-02-20 NOTE — Progress Notes (Signed)
Subjective:    Patient ID: Bobby Gray, male    DOB: 08/27/1950, 64 y.o.   MRN: [id number]  Congestive Heart Failure Presents for follow-up visit. The disease course has been stable. Associated symptoms include fatigue and shortness of breath. Pertinent negatives include no abdominal pain, chest pain, chest pressure, edema, orthopnea or palpitations. The symptoms have been stable. Past treatments include angiotensin receptor blockers, beta blockers and salt and fluid restriction. The treatment provided moderate relief. His past medical history is significant for chronic lung disease and HTN. Compliance with diet is 76-100%.  Hypertension This is a chronic (hasn't taken medications yet today) problem. The current episode started more than 1 year ago. The problem has been gradually improving since onset. The problem is controlled. Associated symptoms include malaise/fatigue and shortness of breath. Pertinent negatives include no chest pain, headaches, neck pain, palpitations or peripheral edema. There are no associated agents to hypertension. Risk factors for coronary artery disease include dyslipidemia, male gender and smoking/tobacco exposure. Past treatments include ACE inhibitors, angiotensin blockers, beta blockers, calcium channel blockers and diuretics. The current treatment provides moderate improvement. Compliance problems include diet.  Hypertensive end-organ damage includes kidney disease and heart failure.    Past Medical History  Diagnosis Date  . CHF (congestive heart failure)     a. previously systolic, now diastolic.  . Atrial flutter     a. 12/2008 s/p Aflutter RFCA.  Marland Kitchen Coronary artery disease     a. 12/2008 Cath: nonobs CAD.  Marland Kitchen Aortic insufficiency     a. 08/2012 Echo: Mild AI.  Marland Kitchen Ascending aortic aneurysm     a. moderately dilated to 4.6 cm at the Sinuses of Valsalva. ascending aorta was mildly dilated to 4.2 cm - stable.  . Hypertension   . Hyperlipidemia   . Tobacco abuse    . Chronic bronchitis   . Cardiomyopathy     a. Presumed to have been tachycardia mediated - 10/2008 Echo: EF 15%;  b. 08/2012 Echo: EF 60-65%, mod LVH, mild AI, mod bi-atrial enlargement.  Marland Kitchen PAF (paroxysmal atrial fibrillation)   . CKD (chronic kidney disease), stage II   . COPD (chronic obstructive pulmonary disease)     Past Surgical History  Procedure Laterality Date  . Esophagogastroduodenoscopy  10/2002    H. Pylori via biopsy Nicolasa Ducking)  . Colonoscopy w/ polypectomy  10/2002    Nicolasa Ducking  . Ct abd w & pelvis wo cm  09/29/2006    Chest w/ nml  . Ct thoracic spine  09/29/2006    w/ nml  . Spect ett  06/15/2007    Mod inf defect w/o ischemia depr LVF (Dr Clayborn Bigness)  . Tee without cardioversion  11/14/2008    LVE EF 15% Diffuse hypokinesis mild-mod AR LAE RAE mod TR  . Cardiac catheterization  01/11/2009    Mild- mod AI, EF 55% 30-40% stenosis first diag LAD   Family History  Problem Relation Age of Onset  . Heart failure Father   . Hypertension Father   . Atrial fibrillation Brother   . Arrhythmia Brother     A fib, cardiomeg has defibrillator  . Kidney failure Brother     declined dialysis  . Kidney disease Brother     Kidney failure, declined dialysis  . Diabetes Neg Hx   . Heart attack Neg Hx   . Breast cancer Neg Hx   . Ovarian cancer Neg Hx   . Uterine cancer Neg Hx   . Alcohol abuse Neg Hx   .  Depression Neg Hx   . Stroke Neg Hx   . Aneurysm Mother 26    brain  . Heart disease Brother   . Heart disease Brother     History  Substance Use Topics  . Smoking status: Current Every Day Smoker -- 0.25 packs/day for 25 years    Types: Cigarettes  . Smokeless tobacco: Never Used     Comment: Recently decreased to 7 cigs/week  . Alcohol Use: No    No Known Allergies  Prior to Admission medications   Medication Sig Start Date End Date Taking? Authorizing Provider  acetaminophen (TYLENOL) 500 MG tablet Take 500 mg by mouth every 6 (six) hours as needed.   Yes  Historical Provider, MD  amLODipine (NORVASC) 10 MG tablet Take 1 tablet (10 mg total) by mouth daily. 01/09/11  Yes Larey Dresser, MD  aspirin 81 MG EC tablet Take 81 mg by mouth daily.     Yes Historical Provider, MD  atorvastatin (LIPITOR) 20 MG tablet Take 20 mg by mouth daily.   Yes Historical Provider, MD  buPROPion (WELLBUTRIN SR) 150 MG 12 hr tablet Take 150 mg by mouth 2 (two) times daily.   Yes Historical Provider, MD  carvedilol (COREG) 25 MG tablet Take 50 mg by mouth 2 (two) times daily with a meal.   Yes Historical Provider, MD  cetirizine (ZYRTEC) 10 MG tablet Take 10 mg by mouth daily.   Yes Historical Provider, MD  ergocalciferol (VITAMIN D2) 50000 UNITS capsule Take 50,000 Units by mouth once a week.   Yes Historical Provider, MD  fluticasone (FLONASE) 50 MCG/ACT nasal spray Place into both nostrils daily.   Yes Historical Provider, MD  Fluticasone-Salmeterol (ADVAIR) 250-50 MCG/DOSE AEPB Inhale 1 puff into the lungs every 12 (twelve) hours.   Yes Historical Provider, MD  furosemide (LASIX) 40 MG tablet Take 40 mg by mouth daily.  12/04/11  Yes Larey Dresser, MD  Ipratropium-Albuterol (COMBIVENT) 20-100 MCG/ACT AERS respimat Inhale 1 puff into the lungs every 6 (six) hours.   Yes Historical Provider, MD  isosorbide-hydrALAZINE (BIDIL) 20-37.5 MG per tablet Take 1 tablet by mouth 3 (three) times daily. 05/17/14  Yes Alisa Graff, FNP  nitroGLYCERIN (NITROSTAT) 0.4 MG SL tablet Place 0.4 mg under the tongue every 5 (five) minutes as needed.     Yes Historical Provider, MD  sacubitril-valsartan (ENTRESTO) 24-26 MG Take 1 tablet by mouth 2 (two) times daily.   Yes Historical Provider, MD  fish oil-omega-3 fatty acids 1000 MG capsule Take 2 g by mouth daily.    Historical Provider, MD     Review of Systems  Constitutional: Positive for malaise/fatigue and fatigue. Negative for appetite change.  HENT: Positive for rhinorrhea. Negative for congestion and sore throat.   Eyes:  Negative.   Respiratory: Positive for cough and shortness of breath. Negative for chest tightness and wheezing.   Cardiovascular: Negative for chest pain, palpitations and leg swelling.  Gastrointestinal: Negative for abdominal pain and abdominal distention.  Endocrine: Negative.   Genitourinary: Negative.   Musculoskeletal: Negative for back pain and neck pain.  Skin: Negative.   Allergic/Immunologic: Negative.   Neurological: Negative for dizziness, light-headedness and headaches.  Hematological: Negative for adenopathy. Does not bruise/bleed easily.  Psychiatric/Behavioral: Negative for sleep disturbance (sleeping on 3 pillows) and dysphoric mood.       Objective:   Physical Exam  Constitutional: He is oriented to person, place, and time. He appears well-developed and well-nourished.  HENT:  Head:  Normocephalic and atraumatic.  Eyes: Conjunctivae are normal. Pupils are equal, round, and reactive to light.  Neck: Normal range of motion. Neck supple.  Cardiovascular: Normal rate and regular rhythm.   Pulmonary/Chest: Effort normal and breath sounds normal. He has no wheezes. He has no rales.  Abdominal: Soft. He exhibits no distension. There is no tenderness.  Musculoskeletal: He exhibits no edema or tenderness.  Neurological: He is alert and oriented to person, place, and time.  Skin: Skin is warm and dry.  Psychiatric: He has a normal mood and affect. His behavior is normal. Thought content normal.  Nursing note and vitals reviewed.    BP 157/92 mmHg  Pulse 71  Resp 20  Ht 5\' 9"  (1.753 m)  Wt 230 lb (104.327 kg)  BMI 33.95 kg/m2  SpO2 93%      Assessment & Plan:  1: Chronic heart failure with preserved ejection fraction- Patient presents with a stable amount of fatigue and shortness of breath upon exertion. He says that he was a little short of breath upon walking into the office but doesn't feel like it's as bad as it has been. Once he sits down to rest his symptoms  resolve pretty quickly. No edema noted. He has gained 11 pounds since he was last here in May 2016. He says that he's weighing "most days" and has noticed a gradual weight gain. He admits to not being too active especially with the hot weather and has been eating more. Discussed the importance of weighing every morning and to call for an overnight weight gain of >2 pounds or a weekly weight gain of >5 pounds. He says that he's still adding "a little" salt to his foods like watermelon. Discussed the importance of not adding any salt to his food and following a 2000mg  sodium diet. Would like to increase his entresto at his next office visit. 2: HTN- Blood pressure elevated today but he says that he hasn't taken his morning medication yet. Is planning on doing so once he gets home and gets something to eat. Discussed the importance of him taking his medications prior to coming to the office so that we know how to adjust his medications. 3: Tobacco use- Patient says that he still smokes 7 cigarettes a week and sometimes less. He says that he tends to smoke when he's upset or aggravated and will then reach for a cigarette. Encouraged him to reach for something other than a cigarette. Discussed cessation for 3 minutes.  Return in one month or sooner for any questions/problems before then.

## 2015-02-20 NOTE — Patient Instructions (Addendum)
Continue weighing daily and call for an overnight weight gain of > 2 pounds or a weekly weight gain of >5 pounds.  Do not add salt to your food.

## 2015-03-28 ENCOUNTER — Ambulatory Visit: Payer: Medicare HMO | Attending: Family | Admitting: Family

## 2015-03-28 ENCOUNTER — Encounter: Payer: Self-pay | Admitting: Family

## 2015-03-28 VITALS — BP 115/77 | HR 65 | Resp 20 | Ht 69.0 in | Wt 237.0 lb

## 2015-03-28 DIAGNOSIS — J449 Chronic obstructive pulmonary disease, unspecified: Secondary | ICD-10-CM | POA: Diagnosis not present

## 2015-03-28 DIAGNOSIS — I4892 Unspecified atrial flutter: Secondary | ICD-10-CM | POA: Insufficient documentation

## 2015-03-28 DIAGNOSIS — I129 Hypertensive chronic kidney disease with stage 1 through stage 4 chronic kidney disease, or unspecified chronic kidney disease: Secondary | ICD-10-CM | POA: Diagnosis not present

## 2015-03-28 DIAGNOSIS — I5022 Chronic systolic (congestive) heart failure: Secondary | ICD-10-CM

## 2015-03-28 DIAGNOSIS — I712 Thoracic aortic aneurysm, without rupture: Secondary | ICD-10-CM | POA: Diagnosis not present

## 2015-03-28 DIAGNOSIS — I5030 Unspecified diastolic (congestive) heart failure: Secondary | ICD-10-CM | POA: Diagnosis present

## 2015-03-28 DIAGNOSIS — N182 Chronic kidney disease, stage 2 (mild): Secondary | ICD-10-CM | POA: Diagnosis not present

## 2015-03-28 DIAGNOSIS — Z7982 Long term (current) use of aspirin: Secondary | ICD-10-CM | POA: Diagnosis not present

## 2015-03-28 DIAGNOSIS — I480 Paroxysmal atrial fibrillation: Secondary | ICD-10-CM | POA: Diagnosis not present

## 2015-03-28 DIAGNOSIS — F17200 Nicotine dependence, unspecified, uncomplicated: Secondary | ICD-10-CM

## 2015-03-28 DIAGNOSIS — J42 Unspecified chronic bronchitis: Secondary | ICD-10-CM | POA: Insufficient documentation

## 2015-03-28 DIAGNOSIS — F17210 Nicotine dependence, cigarettes, uncomplicated: Secondary | ICD-10-CM | POA: Diagnosis not present

## 2015-03-28 DIAGNOSIS — Z79899 Other long term (current) drug therapy: Secondary | ICD-10-CM | POA: Diagnosis not present

## 2015-03-28 DIAGNOSIS — E785 Hyperlipidemia, unspecified: Secondary | ICD-10-CM | POA: Diagnosis not present

## 2015-03-28 DIAGNOSIS — I10 Essential (primary) hypertension: Secondary | ICD-10-CM

## 2015-03-28 DIAGNOSIS — I2510 Atherosclerotic heart disease of native coronary artery without angina pectoris: Secondary | ICD-10-CM | POA: Diagnosis not present

## 2015-03-28 MED ORDER — ATORVASTATIN CALCIUM 20 MG PO TABS: 20.0000 mg | ORAL_TABLET | Freq: Every day | ORAL | Status: AC

## 2015-03-28 NOTE — Progress Notes (Signed)
Subjective:    Patient ID: Bobby Gray, male    DOB: 01-Jun-1951, 64 y.o.   MRN: [id number]  Congestive Heart Failure Presents for follow-up visit. The disease course has been improving. Associated symptoms include fatigue and shortness of breath (better). Pertinent negatives include no abdominal pain, chest pain, chest pressure, edema, orthopnea or palpitations. The symptoms have been improving. Past treatments include angiotensin receptor blockers, beta blockers and salt and fluid restriction. The treatment provided moderate relief. Compliance with prior treatments has been good. His past medical history is significant for arrhythmia, chronic lung disease and HTN. He has multiple 1st degree relatives with heart disease. Compliance with total regimen is 76-100%.  Other This is a chronic (fatigue) problem. The current episode started more than 1 year ago. The problem occurs intermittently. The problem has been unchanged. Associated symptoms include arthralgias (left knee) and fatigue. Pertinent negatives include no abdominal pain, chest pain, congestion, coughing, neck pain, numbness, sore throat, vertigo or weakness. The symptoms are aggravated by walking. He has tried rest for the symptoms. The treatment provided moderate relief.    Past Medical History  Diagnosis Date  . CHF (congestive heart failure)     a. previously systolic, now diastolic.  . Atrial flutter     a. 12/2008 s/p Aflutter RFCA.  Marland Kitchen Coronary artery disease     a. 12/2008 Cath: nonobs CAD.  Marland Kitchen Aortic insufficiency     a. 08/2012 Echo: Mild AI.  Marland Kitchen Ascending aortic aneurysm     a. moderately dilated to 4.6 cm at the Sinuses of Valsalva. ascending aorta was mildly dilated to 4.2 cm - stable.  . Hypertension   . Hyperlipidemia   . Tobacco abuse   . Chronic bronchitis   . Cardiomyopathy     a. Presumed to have been tachycardia mediated - 10/2008 Echo: EF 15%;  b. 08/2012 Echo: EF 60-65%, mod LVH, mild AI, mod bi-atrial  enlargement.  Marland Kitchen PAF (paroxysmal atrial fibrillation)   . CKD (chronic kidney disease), stage II   . COPD (chronic obstructive pulmonary disease)    Past Surgical History  Procedure Laterality Date  . Esophagogastroduodenoscopy  10/2002    H. Pylori via biopsy Nicolasa Ducking)  . Colonoscopy w/ polypectomy  10/2002    Nicolasa Ducking  . Ct abd w & pelvis wo cm  09/29/2006    Chest w/ nml  . Ct thoracic spine  09/29/2006    w/ nml  . Spect ett  06/15/2007    Mod inf defect w/o ischemia depr LVF (Dr Clayborn Bigness)  . Tee without cardioversion  11/14/2008    LVE EF 15% Diffuse hypokinesis mild-mod AR LAE RAE mod TR  . Cardiac catheterization  01/11/2009    Mild- mod AI, EF 55% 30-40% stenosis first diag LAD    Family History  Problem Relation Age of Onset  . Heart failure Father   . Hypertension Father   . Atrial fibrillation Brother   . Arrhythmia Brother     A fib, cardiomeg has defibrillator  . Kidney failure Brother     declined dialysis  . Kidney disease Brother     Kidney failure, declined dialysis  . Diabetes Neg Hx   . Heart attack Neg Hx   . Breast cancer Neg Hx   . Ovarian cancer Neg Hx   . Uterine cancer Neg Hx   . Alcohol abuse Neg Hx   . Depression Neg Hx   . Stroke Neg Hx   . Aneurysm Mother 6  brain  . Heart disease Brother   . Heart disease Brother     Social History  Substance Use Topics  . Smoking status: Current Every Day Smoker -- 0.25 packs/day for 25 years    Types: Cigarettes  . Smokeless tobacco: Never Used     Comment: Recently decreased to 7 cigs/week  . Alcohol Use: No    No Known Allergies  Prior to Admission medications   Medication Sig Start Date End Date Taking? Authorizing Provider  acetaminophen (TYLENOL) 500 MG tablet Take 500 mg by mouth every 6 (six) hours as needed.   Yes Historical Provider, MD  aspirin 81 MG EC tablet Take 81 mg by mouth daily.     Yes Historical Provider, MD  atorvastatin (LIPITOR) 20 MG tablet Take 1 tablet (20 mg total)  by mouth daily. 03/28/15  Yes Alisa Graff, FNP  buPROPion (WELLBUTRIN SR) 150 MG 12 hr tablet Take 150 mg by mouth 2 (two) times daily.   Yes Historical Provider, MD  carvedilol (COREG) 25 MG tablet Take 50 mg by mouth 2 (two) times daily with a meal.   Yes Historical Provider, MD  cetirizine (ZYRTEC) 10 MG tablet Take 10 mg by mouth daily.   Yes Historical Provider, MD  fish oil-omega-3 fatty acids 1000 MG capsule Take 2 g by mouth daily.   Yes Historical Provider, MD  fluticasone (FLONASE) 50 MCG/ACT nasal spray Place into both nostrils daily.   Yes Historical Provider, MD  Fluticasone-Salmeterol (ADVAIR) 250-50 MCG/DOSE AEPB Inhale 1 puff into the lungs every 12 (twelve) hours.   Yes Historical Provider, MD  furosemide (LASIX) 40 MG tablet Take 40 mg by mouth daily.  12/04/11  Yes Larey Dresser, MD  Ipratropium-Albuterol (COMBIVENT) 20-100 MCG/ACT AERS respimat Inhale 1 puff into the lungs every 6 (six) hours.   Yes Historical Provider, MD  isosorbide-hydrALAZINE (BIDIL) 20-37.5 MG per tablet Take 1 tablet by mouth 3 (three) times daily. 05/17/14  Yes Alisa Graff, FNP  nitroGLYCERIN (NITROSTAT) 0.4 MG SL tablet Place 0.4 mg under the tongue every 5 (five) minutes as needed.     Yes Historical Provider, MD  sacubitril-valsartan (ENTRESTO) 24-26 MG Take 1 tablet by mouth 2 (two) times daily.   Yes Historical Provider, MD    Review of Systems  Constitutional: Positive for fatigue. Negative for appetite change.  HENT: Negative for congestion, postnasal drip and sore throat.   Eyes: Negative.   Respiratory: Positive for shortness of breath (better) and wheezing. Negative for cough and chest tightness.   Cardiovascular: Negative for chest pain, palpitations and leg swelling.  Gastrointestinal: Negative for abdominal pain and abdominal distention.  Endocrine: Negative.   Genitourinary: Negative.   Musculoskeletal: Positive for arthralgias (left knee). Negative for back pain and neck pain.   Skin: Negative.   Allergic/Immunologic: Negative.   Neurological: Negative for dizziness, vertigo, weakness, light-headedness and numbness.  Hematological: Negative for adenopathy. Bruises/bleeds easily.  Psychiatric/Behavioral: Negative for sleep disturbance (sleeping on 3 pillows) and dysphoric mood. The patient is not nervous/anxious.        Objective:   Physical Exam  Constitutional: He is oriented to person, place, and time. He appears well-developed and well-nourished.  HENT:  Head: Normocephalic and atraumatic.  Eyes: Conjunctivae are normal. Pupils are equal, round, and reactive to light.  Neck: Normal range of motion. Neck supple.  Cardiovascular: Normal rate and regular rhythm.   Pulmonary/Chest: Effort normal. He has no wheezes. He has no rales.  Abdominal: Soft. He  exhibits no distension. There is no tenderness.  Musculoskeletal: He exhibits no edema or tenderness.  Neurological: He is alert and oriented to person, place, and time.  Skin: Skin is warm and dry.  Psychiatric: He has a normal mood and affect. His behavior is normal. Thought content normal.  Nursing note and vitals reviewed.    BP 115/77 mmHg  Pulse 65  Resp 20  Ht 5\' 9"  (1.753 m)  Wt 237 lb (107.502 kg)  BMI 34.98 kg/m2  SpO2 95%      Assessment & Plan:  1: Chronic heart failure with reduced ejection fraction- Patient presents with improvement in his shortness of breath. He does admit to getting tired still. He has been walking and walked a little too far the other day and was quite tired upon his walk back home. Symptoms do improve upon rest. Denies any swelling in his feet. Continues to weigh and has been weighing daily and hasn't had an overnight weight gain but has noticed a continued gradual gain and feels like he is eating too late at night and has recently resumed walking because of the weather. Encouraged him to be as active as possible and to try and eat earlier in the evening.  Reminded to  call for an overnight weight gain of >2 pounds or a weekly weight gain of >5 pounds. He is not adding any salt to his food. He recently got his entresto bottle filled so will hold up increasing his dosage. Did tell him that when he fills up his pillbox and has no more medication left, to call me and I would call in the 49/51 dosage. Patient verbalized understanding.  2: HTN- Blood pressure looks great today as patient took his medication prior to coming into the office today. Will plan on increasing entresto per above. He is out of his amlodipine but with the increase of entresto in one month and no benefit for his heart failure, will not refill the medication. Could always increase his BiDil as well if needed. 3: Hyperlipidemia- Prescription for his lipitor sent in today as it needed refills.  4: Tobacco use- Patient says that he smokes approximately 7 cigarettes a week. He says that some days he doesn't smoke any and other days, he may smoke 2. Discussed complete cessation for 3 minutes with him.   Return in 10 weeks for recheck after increasing entresto dose. Will plan on checking lab work at next visit.

## 2015-03-28 NOTE — Patient Instructions (Signed)
Continue weighing daily and call for an overnight weight gain of > 2 pounds or a weekly weight gain of >5 pounds.    Smoking Cessation Quitting smoking is important to your health and has many advantages. However, it is not always easy to quit since nicotine is a very addictive drug. Oftentimes, people try 3 times or more before being able to quit. This document explains the best ways for you to prepare to quit smoking. Quitting takes hard work and a lot of effort, but you can do it. ADVANTAGES OF QUITTING SMOKING  You will live longer, feel better, and live better.  Your body will feel the impact of quitting smoking almost immediately.  Within 20 minutes, blood pressure decreases. Your pulse returns to its normal level.  After 8 hours, carbon monoxide levels in the blood return to normal. Your oxygen level increases.  After 24 hours, the chance of having a heart attack starts to decrease. Your breath, hair, and body stop smelling like smoke.  After 48 hours, damaged nerve endings begin to recover. Your sense of taste and smell improve.  After 72 hours, the body is virtually free of nicotine. Your bronchial tubes relax and breathing becomes easier.  After 2 to 12 weeks, lungs can hold more air. Exercise becomes easier and circulation improves.  The risk of having a heart attack, stroke, cancer, or lung disease is greatly reduced.  After 1 year, the risk of coronary heart disease is cut in half.  After 5 years, the risk of stroke falls to the same as a nonsmoker.  After 10 years, the risk of lung cancer is cut in half and the risk of other cancers decreases significantly.  After 15 years, the risk of coronary heart disease drops, usually to the level of a nonsmoker.  If you are pregnant, quitting smoking will improve your chances of having a healthy baby.  The people you live with, especially any children, will be healthier.  You will have extra money to spend on things other  than cigarettes. QUESTIONS TO THINK ABOUT BEFORE ATTEMPTING TO QUIT You may want to talk about your answers with your health care provider.  Why do you want to quit?  If you tried to quit in the past, what helped and what did not?  What will be the most difficult situations for you after you quit? How will you plan to handle them?  Who can help you through the tough times? Your family? Friends? A health care provider?  What pleasures do you get from smoking? What ways can you still get pleasure if you quit? Here are some questions to ask your health care provider:  How can you help me to be successful at quitting?  What medicine do you think would be best for me and how should I take it?  What should I do if I need more help?  What is smoking withdrawal like? How can I get information on withdrawal? GET READY  Set a quit date.  Change your environment by getting rid of all cigarettes, ashtrays, matches, and lighters in your home, car, or work. Do not let people smoke in your home.  Review your past attempts to quit. Think about what worked and what did not. GET SUPPORT AND ENCOURAGEMENT You have a better chance of being successful if you have help. You can get support in many ways.  Tell your family, friends, and coworkers that you are going to quit and need their support. Ask   them not to smoke around you.  Get individual, group, or telephone counseling and support. Programs are available at local hospitals and health centers. Call your local health department for information about programs in your area.  Spiritual beliefs and practices may help some smokers quit.  Download a "quit meter" on your computer to keep track of quit statistics, such as how long you have gone without smoking, cigarettes not smoked, and money saved.  Get a self-help book about quitting smoking and staying off tobacco. LEARN NEW SKILLS AND BEHAVIORS  Distract yourself from urges to smoke. Talk to  someone, go for a walk, or occupy your time with a task.  Change your normal routine. Take a different route to work. Drink tea instead of coffee. Eat breakfast in a different place.  Reduce your stress. Take a hot bath, exercise, or read a book.  Plan something enjoyable to do every day. Reward yourself for not smoking.  Explore interactive web-based programs that specialize in helping you quit. GET MEDICINE AND USE IT CORRECTLY Medicines can help you stop smoking and decrease the urge to smoke. Combining medicine with the above behavioral methods and support can greatly increase your chances of successfully quitting smoking.  Nicotine replacement therapy helps deliver nicotine to your body without the negative effects and risks of smoking. Nicotine replacement therapy includes nicotine gum, lozenges, inhalers, nasal sprays, and skin patches. Some may be available over-the-counter and others require a prescription.  Antidepressant medicine helps people abstain from smoking, but how this works is unknown. This medicine is available by prescription.  Nicotinic receptor partial agonist medicine simulates the effect of nicotine in your brain. This medicine is available by prescription. Ask your health care provider for advice about which medicines to use and how to use them based on your health history. Your health care provider will tell you what side effects to look out for if you choose to be on a medicine or therapy. Carefully read the information on the package. Do not use any other product containing nicotine while using a nicotine replacement product.  RELAPSE OR DIFFICULT SITUATIONS Most relapses occur within the first 3 months after quitting. Do not be discouraged if you start smoking again. Remember, most people try several times before finally quitting. You may have symptoms of withdrawal because your body is used to nicotine. You may crave cigarettes, be irritable, feel very hungry, cough  often, get headaches, or have difficulty concentrating. The withdrawal symptoms are only temporary. They are strongest when you first quit, but they will go away within 10-14 days. To reduce the chances of relapse, try to:  Avoid drinking alcohol. Drinking lowers your chances of successfully quitting.  Reduce the amount of caffeine you consume. Once you quit smoking, the amount of caffeine in your body increases and can give you symptoms, such as a rapid heartbeat, sweating, and anxiety.  Avoid smokers because they can make you want to smoke.  Do not let weight gain distract you. Many smokers will gain weight when they quit, usually less than 10 pounds. Eat a healthy diet and stay active. You can always lose the weight gained after you quit.  Find ways to improve your mood other than smoking. FOR MORE INFORMATION  www.smokefree.gov  Document Released: 06/30/2001 Document Revised: 11/20/2013 Document Reviewed: 10/15/2011 ExitCare Patient Information 2015 [employer]. This information is not intended to replace advice given to you by your health care provider. Make sure you discuss any questions you have with your   health care provider.  

## 2015-05-09 ENCOUNTER — Ambulatory Visit: Payer: Medicare HMO | Admitting: Family

## 2015-05-16 ENCOUNTER — Ambulatory Visit
Admission: RE | Admit: 2015-05-16 | Discharge: 2015-05-16 | Disposition: A | Discharge status: Home / Self Care | Payer: Medicare HMO | Source: Ambulatory Visit | Attending: Gastroenterology | Admitting: Gastroenterology

## 2015-05-16 ENCOUNTER — Ambulatory Visit: Payer: Medicare HMO | Admitting: Anesthesiology

## 2015-05-16 ENCOUNTER — Encounter
Admission: RE | Disposition: A | Discharge status: Home / Self Care | Payer: Self-pay | Source: Ambulatory Visit | Attending: Gastroenterology

## 2015-05-16 DIAGNOSIS — I712 Thoracic aortic aneurysm, without rupture: Secondary | ICD-10-CM | POA: Insufficient documentation

## 2015-05-16 DIAGNOSIS — Z79899 Other long term (current) drug therapy: Secondary | ICD-10-CM | POA: Diagnosis not present

## 2015-05-16 DIAGNOSIS — F17210 Nicotine dependence, cigarettes, uncomplicated: Secondary | ICD-10-CM | POA: Insufficient documentation

## 2015-05-16 DIAGNOSIS — K5730 Diverticulosis of large intestine without perforation or abscess without bleeding: Secondary | ICD-10-CM | POA: Insufficient documentation

## 2015-05-16 DIAGNOSIS — I480 Paroxysmal atrial fibrillation: Secondary | ICD-10-CM | POA: Insufficient documentation

## 2015-05-16 DIAGNOSIS — I5030 Unspecified diastolic (congestive) heart failure: Secondary | ICD-10-CM | POA: Diagnosis not present

## 2015-05-16 DIAGNOSIS — Z1211 Encounter for screening for malignant neoplasm of colon: Secondary | ICD-10-CM | POA: Diagnosis not present

## 2015-05-16 DIAGNOSIS — J449 Chronic obstructive pulmonary disease, unspecified: Secondary | ICD-10-CM | POA: Insufficient documentation

## 2015-05-16 DIAGNOSIS — I2510 Atherosclerotic heart disease of native coronary artery without angina pectoris: Secondary | ICD-10-CM | POA: Insufficient documentation

## 2015-05-16 DIAGNOSIS — N182 Chronic kidney disease, stage 2 (mild): Secondary | ICD-10-CM | POA: Diagnosis not present

## 2015-05-16 DIAGNOSIS — I429 Cardiomyopathy, unspecified: Secondary | ICD-10-CM | POA: Insufficient documentation

## 2015-05-16 DIAGNOSIS — I739 Peripheral vascular disease, unspecified: Secondary | ICD-10-CM | POA: Insufficient documentation

## 2015-05-16 DIAGNOSIS — I130 Hypertensive heart and chronic kidney disease with heart failure and stage 1 through stage 4 chronic kidney disease, or unspecified chronic kidney disease: Secondary | ICD-10-CM | POA: Diagnosis not present

## 2015-05-16 DIAGNOSIS — K640 First degree hemorrhoids: Secondary | ICD-10-CM | POA: Insufficient documentation

## 2015-05-16 DIAGNOSIS — I351 Nonrheumatic aortic (valve) insufficiency: Secondary | ICD-10-CM | POA: Insufficient documentation

## 2015-05-16 DIAGNOSIS — E785 Hyperlipidemia, unspecified: Secondary | ICD-10-CM | POA: Insufficient documentation

## 2015-05-16 DIAGNOSIS — Z7982 Long term (current) use of aspirin: Secondary | ICD-10-CM | POA: Insufficient documentation

## 2015-05-16 DIAGNOSIS — Z6833 Body mass index (BMI) 33.0-33.9, adult: Secondary | ICD-10-CM | POA: Diagnosis not present

## 2015-05-16 HISTORY — PX: COLONOSCOPY WITH PROPOFOL: SHX5780

## 2015-05-16 HISTORY — DX: Reserved for inherently not codable concepts without codable children: IMO0001

## 2015-05-16 SURGERY — COLONOSCOPY WITH PROPOFOL
Anesthesia: General

## 2015-05-16 MED ORDER — SODIUM CHLORIDE 0.9 % IV SOLN
INTRAVENOUS | Status: DC
  Administered 2015-05-16: 1000 mL via INTRAVENOUS

## 2015-05-16 MED ORDER — PROPOFOL 500 MG/50ML IV EMUL
INTRAVENOUS | Status: DC | PRN
  Administered 2015-05-16: 50 ug/kg/min via INTRAVENOUS

## 2015-05-16 MED ORDER — LACTATED RINGERS IV SOLN
INTRAVENOUS | Status: DC | PRN
  Administered 2015-05-16: 09:00:00 via INTRAVENOUS

## 2015-05-16 NOTE — Op Note (Signed)
[facility] Gastroenterology Patient Name: Bobby Gray Procedure Date: 05/16/2015 8:53 AM MRN: [id number] Account #: [id number] Date of Birth: 1950-08-05 Admit Type: Outpatient Age: 64 Room: [facility] ENDO ROOM 3 Gender: Male Note Status: Finalized Procedure:         Colonoscopy Indications:       Screening for colorectal malignant neoplasm, Last                     colonoscopy: 2004 Patient Profile:   This is a 64 year old male. Providers:         Gerrit Heck. Rayann Heman, MD Referring MD:      Venetia Maxon. Elijio Miles, MD (Referring MD) Medicines:         Propofol per Anesthesia Complications:     No immediate complications. Procedure:         Pre-Anesthesia Assessment:                    - Prior to the procedure, a History and Physical was                     performed, and patient medications, allergies and                     sensitivities were reviewed. The patient's tolerance of                     previous anesthesia was reviewed.                    After obtaining informed consent, the colonoscope was                     passed under direct vision. Throughout the procedure, the                     patient's blood pressure, pulse, and oxygen saturations                     were monitored continuously. The Colonoscope was                     introduced through the anus and advanced to the the cecum,                     identified by appendiceal orifice and ileocecal valve. The                     colonoscopy was unusually difficult due to multiple                     diverticula in the colon and restricted mobility of the                     colon. Successful completion of the procedure was aided by                     using manual pressure. The patient tolerated the procedure                     well. The quality of the bowel preparation was good. Findings:      The perianal and digital rectal examinations were normal.      Many small and large-mouthed diverticula were  found in the recto-sigmoid  colon, in the sigmoid colon, in the descending colon, at the splenic       flexure, in the transverse colon, at the hepatic flexure and in the       ascending colon.      Internal hemorrhoids were found during retroflexion. The hemorrhoids       were Grade I (internal hemorrhoids that do not prolapse).      The exam was otherwise without abnormality. Impression:        - Diverticulosis in the recto-sigmoid colon, in the                     sigmoid colon, in the descending colon, at the splenic                     flexure, in the transverse colon, at the hepatic flexure                     and in the ascending colon.                    - Internal hemorrhoids.                    - The examination was otherwise normal.                    - No specimens collected. Recommendation:    - Observe patient in GI recovery unit.                    - High fiber diet.                    - Continue present medications.                    - Repeat colonoscopy in 10 years for screening purposes.                    - Return to referring physician.                    - The findings and recommendations were discussed with the                     patient.                    - The findings and recommendations were discussed with the                     patient's family. Procedure Code(s): --- Professional ---                    [phone], Colonoscopy, flexible; diagnostic, including                     collection of specimen(s) by brushing or washing, when                     performed (separate procedure) CPT copyright 2014 American Medical Association. All rights reserved. The codes documented in this report are preliminary and upon coder review may  be revised to meet current compliance requirements. Mellody Life, MD 05/16/2015 9:41:22 AM This report has been signed electronically. Number of Addenda: 0 Note Initiated On: 05/16/2015 8:53 AM Scope Withdrawal Time: 0 hours  15 minutes 8 seconds  Total Procedure  Duration: 0 hours 27 minutes 48 seconds       [facility]

## 2015-05-16 NOTE — H&P (Signed)
Primary Care Physician:  Volanda Napoleon, MD  Pre-Procedure History & Physical: HPI:  Bobby Gray is a 64 y.o. male is here for an colonoscopy.   Past Medical History  Diagnosis Date  . CHF (congestive heart failure) (HCC)     a. previously systolic, now diastolic.  . Atrial flutter ([place])     a. 12/2008 s/p Aflutter RFCA.  Marland Kitchen Coronary artery disease     a. 12/2008 Cath: nonobs CAD.  Marland Kitchen Aortic insufficiency     a. 08/2012 Echo: Mild AI.  Marland Kitchen Ascending aortic aneurysm (HCC)     a. moderately dilated to 4.6 cm at the Sinuses of Valsalva. ascending aorta was mildly dilated to 4.2 cm - stable.  . Hypertension   . Hyperlipidemia   . Tobacco abuse   . Chronic bronchitis ([place])   . Cardiomyopathy ([place])     a. Presumed to have been tachycardia mediated - 10/2008 Echo: EF 15%;  b. 08/2012 Echo: EF 60-65%, mod LVH, mild AI, mod bi-atrial enlargement.  Marland Kitchen PAF (paroxysmal atrial fibrillation) ([place])   . CKD (chronic kidney disease), stage II   . COPD (chronic obstructive pulmonary disease) ([place])   . Shortness of breath dyspnea     Past Surgical History  Procedure Laterality Date  . Esophagogastroduodenoscopy  10/2002    H. Pylori via biopsy Nicolasa Ducking)  . Colonoscopy w/ polypectomy  10/2002    Nicolasa Ducking  . Ct abd w & pelvis wo cm  09/29/2006    Chest w/ nml  . Ct thoracic spine  09/29/2006    w/ nml  . Spect ett  06/15/2007    Mod inf defect w/o ischemia depr LVF (Dr Clayborn Bigness)  . Tee without cardioversion  11/14/2008    LVE EF 15% Diffuse hypokinesis mild-mod AR LAE RAE mod TR  . Cardiac catheterization  01/11/2009    Mild- mod AI, EF 55% 30-40% stenosis first diag LAD    Prior to Admission medications   Medication Sig Start Date End Date Taking? Authorizing Provider  aspirin 81 MG EC tablet Take 81 mg by mouth daily.     Yes Historical Provider, MD  carvedilol (COREG) 25 MG tablet Take 50 mg by mouth 2 (two) times daily with a meal.   Yes Historical Provider, MD  isosorbide-hydrALAZINE  (BIDIL) 20-37.5 MG per tablet Take 1 tablet by mouth 3 (three) times daily. 05/17/14  Yes Alisa Graff, FNP  sacubitril-valsartan (ENTRESTO) 24-26 MG Take 1 tablet by mouth 2 (two) times daily.   Yes Historical Provider, MD  acetaminophen (TYLENOL) 500 MG tablet Take 500 mg by mouth every 6 (six) hours as needed.    Historical Provider, MD  atorvastatin (LIPITOR) 20 MG tablet Take 1 tablet (20 mg total) by mouth daily. 03/28/15   Alisa Graff, FNP  buPROPion (WELLBUTRIN SR) 150 MG 12 hr tablet Take 150 mg by mouth 2 (two) times daily.    Historical Provider, MD  cetirizine (ZYRTEC) 10 MG tablet Take 10 mg by mouth daily.    Historical Provider, MD  fish oil-omega-3 fatty acids 1000 MG capsule Take 2 g by mouth daily.    Historical Provider, MD  fluticasone (FLONASE) 50 MCG/ACT nasal spray Place into both nostrils daily.    Historical Provider, MD  Fluticasone-Salmeterol (ADVAIR) 250-50 MCG/DOSE AEPB Inhale 1 puff into the lungs every 12 (twelve) hours.    Historical Provider, MD  furosemide (LASIX) 40 MG tablet Take 40 mg by mouth daily.  12/04/11   [place]  Claris Gladden, MD  Ipratropium-Albuterol (COMBIVENT) 20-100 MCG/ACT AERS respimat Inhale 1 puff into the lungs every 6 (six) hours.    Historical Provider, MD  nitroGLYCERIN (NITROSTAT) 0.4 MG SL tablet Place 0.4 mg under the tongue every 5 (five) minutes as needed.      Historical Provider, MD    Allergies as of 04/19/2015  . (No Known Allergies)    Family History  Problem Relation Age of Onset  . Heart failure Father   . Hypertension Father   . Atrial fibrillation Brother   . Arrhythmia Brother     A fib, cardiomeg has defibrillator  . Kidney failure Brother     declined dialysis  . Kidney disease Brother     Kidney failure, declined dialysis  . Diabetes Neg Hx   . Heart attack Neg Hx   . Breast cancer Neg Hx   . Ovarian cancer Neg Hx   . Uterine cancer Neg Hx   . Alcohol abuse Neg Hx   . Depression Neg Hx   . Stroke Neg Hx   .  Aneurysm Mother 56    brain  . Heart disease Brother   . Heart disease Brother     Social History   Social History  . Marital Status: Married    Spouse Name: N/A  . Number of Children: N/A  . Years of Education: N/A   Occupational History  . 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 Smoking status: Current Every Day Smoker -- 0.25 packs/day for 25 years    Types: Cigarettes  . Smokeless tobacco: Never Used     Comment: Recently decreased to 7 cigs/week  . Alcohol Use: No  . Drug Use: No  . Sexual Activity: Not on file   Other Topics Concern  . Not on file   Social History Narrative   Married, living apart   1 son, 24   1 daughter, 7     Physical Exam: BP 165/86 mmHg  Pulse 69  Temp(Src) 96.1 F (35.6 C) (Tympanic)  Resp 19  Ht 5\' 9"  (1.753 m)  Wt 101.606 kg (224 lb)  BMI 33.06 kg/m2  SpO2 93% General:   Alert,  pleasant and cooperative in NAD Head:  Normocephalic and atraumatic. Neck:  Supple; no masses or thyromegaly. Lungs:  Clear throughout to auscultation.    Heart:  Regular rate and rhythm. Abdomen:  Soft, nontender and nondistended. Normal bowel sounds, without guarding, and without rebound.   Neurologic:  Alert and  oriented x4;  grossly normal neurologically.  Impression/Plan: Bobby Gray is here for an colonoscopy to be performed for screening  Risks, benefits, limitations, and alternatives regarding  colonoscopy have been reviewed with the patient.  Questions have been answered.  All parties agreeable.   Josefine Class, MD  05/16/2015, 8:52 AM

## 2015-05-16 NOTE — Discharge Instructions (Signed)

## 2015-05-16 NOTE — Anesthesia Preprocedure Evaluation (Signed)
Anesthesia Evaluation  Patient identified by MRN, date of birth, ID band Patient awake    Reviewed: Allergy & Precautions, NPO status   Airway Mallampati: III       Dental  (+) Partial Upper   Pulmonary shortness of breath and with exertion, COPD, Current Smoker,    + rhonchi  + decreased breath sounds      Cardiovascular hypertension, Pt. on medications + CAD, + Peripheral Vascular Disease and +CHF   Rhythm:Irregular Rate:Normal     Neuro/Psych    GI/Hepatic negative GI ROS, Neg liver ROS,   Endo/Other  Morbid obesity  Renal/GU      Musculoskeletal   Abdominal (+) + obese,   Peds  Hematology   Anesthesia Other Findings   Reproductive/Obstetrics                             Anesthesia Physical Anesthesia Plan  ASA: III  Anesthesia Plan: General   Post-op Pain Management:    Induction: Intravenous  Airway Management Planned: Nasal Cannula  Additional Equipment:   Intra-op Plan:   Post-operative Plan:   Informed Consent: I have reviewed the patients History and Physical, chart, labs and discussed the procedure including the risks, benefits and alternatives for the proposed anesthesia with the patient or authorized representative who has indicated his/her understanding and acceptance.     Plan Discussed with: CRNA  Anesthesia Plan Comments:         Anesthesia Quick Evaluation

## 2015-05-16 NOTE — Anesthesia Postprocedure Evaluation (Signed)
  Anesthesia Post-op Note  Patient: Bobby Gray  Procedure(s) Performed: Procedure(s): COLONOSCOPY WITH PROPOFOL (N/A)  Anesthesia type:General  Patient location: PACU  Post pain: Pain level controlled  Post assessment: Post-op Vital signs reviewed, Patient's Cardiovascular Status Stable, Respiratory Function Stable, Patent Airway and No signs of Nausea or vomiting  Post vital signs: Reviewed and stable  Last Vitals:  Filed Vitals:   05/16/15 0943  BP: 121/68  Pulse: 59  Temp: 36 C  Resp:     Level of consciousness: awake, alert  and patient cooperative  Complications: No apparent anesthesia complications

## 2015-05-16 NOTE — Transfer of Care (Signed)
Immediate Anesthesia Transfer of Care Note   Patient: Bobby Gray  Procedure(s) Performed: Procedure(s): COLONOSCOPY WITH PROPOFOL (N/A)  Patient Location: PACU  Anesthesia Type:General  Level of Consciousness: awake and alert   Airway & Oxygen Therapy: Patient Spontanous Breathing  Post-op Assessment: Report given to RN and Post -op Vital signs reviewed and stable  Post vital signs: Reviewed and stable   Last Vitals:  Filed Vitals:   05/16/15 0943  BP:   Pulse:   Temp: 36 C  Resp:     Complications: No apparent anesthesia complications

## 2015-05-17 NOTE — Progress Notes (Signed)
Pt complaining of back pain . INSTRUCTED TO CALL MD. THIS AM.

## 2015-05-19 ENCOUNTER — Encounter: Payer: Self-pay | Admitting: Gastroenterology

## 2015-06-06 ENCOUNTER — Ambulatory Visit: Payer: Medicare HMO | Admitting: Family

## 2015-06-12 ENCOUNTER — Encounter: Payer: Self-pay | Admitting: Family

## 2015-06-12 ENCOUNTER — Ambulatory Visit: Payer: Medicare HMO | Attending: Family | Admitting: Family

## 2015-06-12 VITALS — BP 149/94 | HR 70 | Resp 20 | Ht 69.0 in | Wt 237.0 lb

## 2015-06-12 DIAGNOSIS — F17210 Nicotine dependence, cigarettes, uncomplicated: Secondary | ICD-10-CM | POA: Diagnosis not present

## 2015-06-12 DIAGNOSIS — I129 Hypertensive chronic kidney disease with stage 1 through stage 4 chronic kidney disease, or unspecified chronic kidney disease: Secondary | ICD-10-CM | POA: Diagnosis not present

## 2015-06-12 DIAGNOSIS — I5022 Chronic systolic (congestive) heart failure: Secondary | ICD-10-CM | POA: Insufficient documentation

## 2015-06-12 DIAGNOSIS — J449 Chronic obstructive pulmonary disease, unspecified: Secondary | ICD-10-CM | POA: Insufficient documentation

## 2015-06-12 DIAGNOSIS — Z79899 Other long term (current) drug therapy: Secondary | ICD-10-CM | POA: Insufficient documentation

## 2015-06-12 DIAGNOSIS — E785 Hyperlipidemia, unspecified: Secondary | ICD-10-CM | POA: Insufficient documentation

## 2015-06-12 DIAGNOSIS — N182 Chronic kidney disease, stage 2 (mild): Secondary | ICD-10-CM | POA: Insufficient documentation

## 2015-06-12 DIAGNOSIS — I480 Paroxysmal atrial fibrillation: Secondary | ICD-10-CM | POA: Diagnosis not present

## 2015-06-12 DIAGNOSIS — I10 Essential (primary) hypertension: Secondary | ICD-10-CM

## 2015-06-12 DIAGNOSIS — F17200 Nicotine dependence, unspecified, uncomplicated: Secondary | ICD-10-CM

## 2015-06-12 DIAGNOSIS — I429 Cardiomyopathy, unspecified: Secondary | ICD-10-CM | POA: Insufficient documentation

## 2015-06-12 DIAGNOSIS — I2510 Atherosclerotic heart disease of native coronary artery without angina pectoris: Secondary | ICD-10-CM | POA: Insufficient documentation

## 2015-06-12 MED ORDER — SACUBITRIL-VALSARTAN 49-51 MG PO TABS: 1.0000 | ORAL_TABLET | Freq: Two times a day (BID) | ORAL | Status: AC

## 2015-06-12 NOTE — Patient Instructions (Signed)
Continue weighing daily and call for an overnight weight gain of > 2 pounds or a weekly weight gain of >5 pounds.    Smoking Cessation Quitting smoking is important to your health and has many advantages. However, it is not always easy to quit since nicotine is a very addictive drug. Oftentimes, people try 3 times or more before being able to quit. This document explains the best ways for you to prepare to quit smoking. Quitting takes hard work and a lot of effort, but you can do it. ADVANTAGES OF QUITTING SMOKING  You will live longer, feel better, and live better.  Your body will feel the impact of quitting smoking almost immediately.  Within 20 minutes, blood pressure decreases. Your pulse returns to its normal level.  After 8 hours, carbon monoxide levels in the blood return to normal. Your oxygen level increases.  After 24 hours, the chance of having a heart attack starts to decrease. Your breath, hair, and body stop smelling like smoke.  After 48 hours, damaged nerve endings begin to recover. Your sense of taste and smell improve.  After 72 hours, the body is virtually free of nicotine. Your bronchial tubes relax and breathing becomes easier.  After 2 to 12 weeks, lungs can hold more air. Exercise becomes easier and circulation improves.  The risk of having a heart attack, stroke, cancer, or lung disease is greatly reduced.  After 1 year, the risk of coronary heart disease is cut in half.  After 5 years, the risk of stroke falls to the same as a nonsmoker.  After 10 years, the risk of lung cancer is cut in half and the risk of other cancers decreases significantly.  After 15 years, the risk of coronary heart disease drops, usually to the level of a nonsmoker.  If you are pregnant, quitting smoking will improve your chances of having a healthy baby.  The people you live with, especially any children, will be healthier.  You will have extra money to spend on things other  than cigarettes. QUESTIONS TO THINK ABOUT BEFORE ATTEMPTING TO QUIT You may want to talk about your answers with your health care provider.  Why do you want to quit?  If you tried to quit in the past, what helped and what did not?  What will be the most difficult situations for you after you quit? How will you plan to handle them?  Who can help you through the tough times? Your family? Friends? A health care provider?  What pleasures do you get from smoking? What ways can you still get pleasure if you quit? Here are some questions to ask your health care provider:  How can you help me to be successful at quitting?  What medicine do you think would be best for me and how should I take it?  What should I do if I need more help?  What is smoking withdrawal like? How can I get information on withdrawal? GET READY  Set a quit date.  Change your environment by getting rid of all cigarettes, ashtrays, matches, and lighters in your home, car, or work. Do not let people smoke in your home.  Review your past attempts to quit. Think about what worked and what did not. GET SUPPORT AND ENCOURAGEMENT You have a better chance of being successful if you have help. You can get support in many ways.  Tell your family, friends, and coworkers that you are going to quit and need their support. Ask   them not to smoke around you.  Get individual, group, or telephone counseling and support. Programs are available at local hospitals and health centers. Call your local health department for information about programs in your area.  Spiritual beliefs and practices may help some smokers quit.  Download a "quit meter" on your computer to keep track of quit statistics, such as how long you have gone without smoking, cigarettes not smoked, and money saved.  Get a self-help book about quitting smoking and staying off tobacco. LEARN NEW SKILLS AND BEHAVIORS  Distract yourself from urges to smoke. Talk to  someone, go for a walk, or occupy your time with a task.  Change your normal routine. Take a different route to work. Drink tea instead of coffee. Eat breakfast in a different place.  Reduce your stress. Take a hot bath, exercise, or read a book.  Plan something enjoyable to do every day. Reward yourself for not smoking.  Explore interactive web-based programs that specialize in helping you quit. GET MEDICINE AND USE IT CORRECTLY Medicines can help you stop smoking and decrease the urge to smoke. Combining medicine with the above behavioral methods and support can greatly increase your chances of successfully quitting smoking.  Nicotine replacement therapy helps deliver nicotine to your body without the negative effects and risks of smoking. Nicotine replacement therapy includes nicotine gum, lozenges, inhalers, nasal sprays, and skin patches. Some may be available over-the-counter and others require a prescription.  Antidepressant medicine helps people abstain from smoking, but how this works is unknown. This medicine is available by prescription.  Nicotinic receptor partial agonist medicine simulates the effect of nicotine in your brain. This medicine is available by prescription. Ask your health care provider for advice about which medicines to use and how to use them based on your health history. Your health care provider will tell you what side effects to look out for if you choose to be on a medicine or therapy. Carefully read the information on the package. Do not use any other product containing nicotine while using a nicotine replacement product.  RELAPSE OR DIFFICULT SITUATIONS Most relapses occur within the first 3 months after quitting. Do not be discouraged if you start smoking again. Remember, most people try several times before finally quitting. You may have symptoms of withdrawal because your body is used to nicotine. You may crave cigarettes, be irritable, feel very hungry, cough  often, get headaches, or have difficulty concentrating. The withdrawal symptoms are only temporary. They are strongest when you first quit, but they will go away within 10-14 days. To reduce the chances of relapse, try to:  Avoid drinking alcohol. Drinking lowers your chances of successfully quitting.  Reduce the amount of caffeine you consume. Once you quit smoking, the amount of caffeine in your body increases and can give you symptoms, such as a rapid heartbeat, sweating, and anxiety.  Avoid smokers because they can make you want to smoke.  Do not let weight gain distract you. Many smokers will gain weight when they quit, usually less than 10 pounds. Eat a healthy diet and stay active. You can always lose the weight gained after you quit.  Find ways to improve your mood other than smoking. FOR MORE INFORMATION  www.smokefree.gov  Document Released: 06/30/2001 Document Revised: 11/20/2013 Document Reviewed: 10/15/2011 ExitCare Patient Information 2015 [employer]. This information is not intended to replace advice given to you by your health care provider. Make sure you discuss any questions you have with your   health care provider.  

## 2015-06-12 NOTE — Progress Notes (Signed)
Subjective:    Patient ID: Bobby Gray, male    DOB: 15-Oct-1950, 64 y.o.   MRN: [id number]  Congestive Heart Failure Presents for follow-up visit. The disease course has been stable. Associated symptoms include fatigue, orthopnea (sleeping on 3 pillows) and shortness of breath. Pertinent negatives include no abdominal pain, chest pain, edema or palpitations. The symptoms have been stable. Past treatments include angiotensin receptor blockers, beta blockers and salt and fluid restriction. The treatment provided moderate relief. Compliance with prior treatments has been good. His past medical history is significant for arrhythmia, CAD, chronic lung disease and HTN. He has one 1st degree relative with heart disease. Compliance with total regimen is 76-100%.  Hypertension This is a chronic problem. The current episode started more than 1 year ago. The problem has been waxing and waning since onset. Associated symptoms include shortness of breath. Pertinent negatives include no chest pain, headaches, neck pain, palpitations or peripheral edema. There are no associated agents to hypertension. Risk factors for coronary artery disease include dyslipidemia, family history, male gender, sedentary lifestyle and smoking/tobacco exposure. Past treatments include ACE inhibitors, angiotensin blockers, beta blockers, diuretics and lifestyle changes. The current treatment provides moderate improvement. Hypertensive end-organ damage includes kidney disease and heart failure.    Past Medical History  Diagnosis Date  . CHF (congestive heart failure) (HCC)     a. previously systolic, now diastolic.  . Atrial flutter ([place])     a. 12/2008 s/p Aflutter RFCA.  Marland Kitchen Coronary artery disease     a. 12/2008 Cath: nonobs CAD.  Marland Kitchen Aortic insufficiency     a. 08/2012 Echo: Mild AI.  Marland Kitchen Ascending aortic aneurysm (HCC)     a. moderately dilated to 4.6 cm at the Sinuses of Valsalva. ascending aorta was mildly dilated to 4.2 cm -  stable.  . Hypertension   . Hyperlipidemia   . Tobacco abuse   . Chronic bronchitis ([place])   . Cardiomyopathy ([place])     a. Presumed to have been tachycardia mediated - 10/2008 Echo: EF 15%;  b. 08/2012 Echo: EF 60-65%, mod LVH, mild AI, mod bi-atrial enlargement.  Marland Kitchen PAF (paroxysmal atrial fibrillation) ([place])   . CKD (chronic kidney disease), stage II   . COPD (chronic obstructive pulmonary disease) ([place])   . Shortness of breath dyspnea     Past Surgical History  Procedure Laterality Date  . Esophagogastroduodenoscopy  10/2002    H. Pylori via biopsy Nicolasa Ducking)  . Colonoscopy w/ polypectomy  10/2002    Nicolasa Ducking  . Ct abd w & pelvis wo cm  09/29/2006    Chest w/ nml  . Ct thoracic spine  09/29/2006    w/ nml  . Spect ett  06/15/2007    Mod inf defect w/o ischemia depr LVF (Dr Clayborn Bigness)  . Tee without cardioversion  11/14/2008    LVE EF 15% Diffuse hypokinesis mild-mod AR LAE RAE mod TR  . Cardiac catheterization  01/11/2009    Mild- mod AI, EF 55% 30-40% stenosis first diag LAD  . Colonoscopy with propofol N/A 05/16/2015    Procedure: COLONOSCOPY WITH PROPOFOL;  Surgeon: Josefine Class, MD;  Location[facility] ENDOSCOPY;  Service: Endoscopy;  Laterality: N/A;    Family History  Problem Relation Age of Onset  . Heart failure Father   . Hypertension Father   . Atrial fibrillation Brother   . Arrhythmia Brother     A fib, cardiomeg has defibrillator  . Kidney failure Brother     declined  dialysis  . Kidney disease Brother     Kidney failure, declined dialysis  . Diabetes Neg Hx   . Heart attack Neg Hx   . Breast cancer Neg Hx   . Ovarian cancer Neg Hx   . Uterine cancer Neg Hx   . Alcohol abuse Neg Hx   . Depression Neg Hx   . Stroke Neg Hx   . Aneurysm Mother 50    brain  . Heart disease Brother   . Heart disease Brother     Social History  Substance Use Topics  . Smoking status: Current Every Day Smoker -- 0.25 packs/day for 25 years    Types: Cigarettes  .  Smokeless tobacco: Never Used     Comment: Recently decreased to 7 cigs/week  . Alcohol Use: No    No Known Allergies  Prior to Admission medications   Medication Sig Start Date End Date Taking? Authorizing Provider  acetaminophen (TYLENOL) 500 MG tablet Take 500 mg by mouth every 6 (six) hours as needed.   Yes Historical Provider, MD  aspirin 81 MG EC tablet Take 81 mg by mouth daily.     Yes Historical Provider, MD  atorvastatin (LIPITOR) 20 MG tablet Take 1 tablet (20 mg total) by mouth daily. 03/28/15  Yes Alisa Graff, FNP  buPROPion (WELLBUTRIN SR) 150 MG 12 hr tablet Take 150 mg by mouth 2 (two) times daily.   Yes Historical Provider, MD  carvedilol (COREG) 25 MG tablet Take 50 mg by mouth 2 (two) times daily with a meal.   Yes Historical Provider, MD  cetirizine (ZYRTEC) 10 MG tablet Take 10 mg by mouth daily.   Yes Historical Provider, MD  fish oil-omega-3 fatty acids 1000 MG capsule Take 2 g by mouth daily.   Yes Historical Provider, MD  fluticasone (FLONASE) 50 MCG/ACT nasal spray Place into both nostrils daily.   Yes Historical Provider, MD  Fluticasone-Salmeterol (ADVAIR) 250-50 MCG/DOSE AEPB Inhale 1 puff into the lungs every 12 (twelve) hours.   Yes Historical Provider, MD  furosemide (LASIX) 40 MG tablet Take 40 mg by mouth daily.  12/04/11  Yes Larey Dresser, MD  Ipratropium-Albuterol (COMBIVENT) 20-100 MCG/ACT AERS respimat Inhale 1 puff into the lungs every 6 (six) hours.   Yes Historical Provider, MD  isosorbide-hydrALAZINE (BIDIL) 20-37.5 MG per tablet Take 1 tablet by mouth 3 (three) times daily. 05/17/14  Yes Alisa Graff, FNP  nitroGLYCERIN (NITROSTAT) 0.4 MG SL tablet Place 0.4 mg under the tongue every 5 (five) minutes as needed.     Yes Historical Provider, MD  sacubitril-valsartan (ENTRESTO) 49-51 MG Take 1 tablet by mouth 2 (two) times daily. 06/12/15   Alisa Graff, FNP     Review of Systems  Constitutional: Positive for fatigue. Negative for appetite  change.  HENT: Negative for congestion, postnasal drip and sore throat.   Eyes: Negative.   Respiratory: Positive for shortness of breath. Negative for cough, chest tightness and wheezing.   Cardiovascular: Negative for chest pain, palpitations and leg swelling.  Gastrointestinal: Negative for abdominal pain and abdominal distention.  Endocrine: Negative.   Genitourinary: Negative.   Musculoskeletal: Negative.  Negative for neck pain.  Skin: Negative.   Allergic/Immunologic: Negative.   Neurological: Negative for dizziness, light-headedness and headaches.  Hematological: Negative for adenopathy. Does not bruise/bleed easily.  Psychiatric/Behavioral: Negative for sleep disturbance (sleeping on 3 pillows) and dysphoric mood. The patient is not nervous/anxious.        Objective:  Physical Exam  Constitutional: He is oriented to person, place, and time. He appears well-developed and well-nourished.  HENT:  Head: Normocephalic and atraumatic.  Eyes: Conjunctivae are normal. Pupils are equal, round, and reactive to light.  Neck: Normal range of motion. Neck supple.  Cardiovascular: Normal rate and regular rhythm.   Pulmonary/Chest: Effort normal. He has no wheezes. He has no rales.  Abdominal: Soft. He exhibits no distension. There is no tenderness.  Musculoskeletal: He exhibits no edema or tenderness.  Neurological: He is alert and oriented to person, place, and time.  Skin: Skin is warm and dry.  Psychiatric: He has a normal mood and affect. His behavior is normal. Thought content normal.  Nursing note and vitals reviewed.   BP 149/94 mmHg  Pulse 70  Resp 20  Ht 5\' 9"  (1.753 m)  Wt 237 lb (107.502 kg)  BMI 34.98 kg/m2  SpO2 94%       Assessment & Plan:  1: Chronic heart failure with reduced ejection fraction- Patient presents with fatigue and shortness of breath upon exertion (Class II). He said that he was a little short of breath upon walking into the office today but  once he sat down for a few minutes, his breathing improved. He continues to weigh himself daily and reports a stable weight. Reminded to call for an overnight weight gain of >2 pounds or a weekly weight gain of >5 pounds. He is not adding any salt to his food and tries to closely monitor his sodium intake. He has continued to take the entresto 24/26mg  and last filled it on 05/28/15. He was instructed to finish the current bottle and a new prescription for the 49/51mg  dosage was sent to his pharmacy for him to begin once he finishes the lower dose. Will check lab work at next visit after he's been on the higher dose for a few weeks. Did receive his flu vaccine and a shingles vaccine since he was here last. [facility] went in with patient to review his medications.  2: HTN- Blood pressure elevated today but was good last time. Will continue to monitor. Adjusting entresto per above. 3: Tobacco use- Patient continues to smoke about 1/4 ppd of cigarettes and says that he's working on quitting. Complete cessation discussed for 3 minutes with the patient.  Return in 6 weeks or sooner for any questions/problems before then.

## 2015-07-07 ENCOUNTER — Encounter: Payer: Self-pay | Admitting: *Deleted

## 2015-07-07 ENCOUNTER — Emergency Department: Payer: Medicare HMO

## 2015-07-07 ENCOUNTER — Inpatient Hospital Stay
Admission: EM | Admit: 2015-07-07 | Discharge: 2015-07-21 | DRG: 064 | Disposition: E | Payer: Medicare HMO | Attending: Internal Medicine | Admitting: Internal Medicine

## 2015-07-07 DIAGNOSIS — I5022 Chronic systolic (congestive) heart failure: Secondary | ICD-10-CM | POA: Diagnosis present

## 2015-07-07 DIAGNOSIS — Z8673 Personal history of transient ischemic attack (TIA), and cerebral infarction without residual deficits: Secondary | ICD-10-CM

## 2015-07-07 DIAGNOSIS — I469 Cardiac arrest, cause unspecified: Secondary | ICD-10-CM | POA: Diagnosis present

## 2015-07-07 DIAGNOSIS — Z841 Family history of disorders of kidney and ureter: Secondary | ICD-10-CM

## 2015-07-07 DIAGNOSIS — J449 Chronic obstructive pulmonary disease, unspecified: Secondary | ICD-10-CM | POA: Diagnosis present

## 2015-07-07 DIAGNOSIS — I4892 Unspecified atrial flutter: Secondary | ICD-10-CM | POA: Diagnosis present

## 2015-07-07 DIAGNOSIS — I130 Hypertensive heart and chronic kidney disease with heart failure and stage 1 through stage 4 chronic kidney disease, or unspecified chronic kidney disease: Secondary | ICD-10-CM | POA: Diagnosis present

## 2015-07-07 DIAGNOSIS — I613 Nontraumatic intracerebral hemorrhage in brain stem: Principal | ICD-10-CM

## 2015-07-07 DIAGNOSIS — Z8249 Family history of ischemic heart disease and other diseases of the circulatory system: Secondary | ICD-10-CM

## 2015-07-07 DIAGNOSIS — I2510 Atherosclerotic heart disease of native coronary artery without angina pectoris: Secondary | ICD-10-CM | POA: Diagnosis present

## 2015-07-07 DIAGNOSIS — I429 Cardiomyopathy, unspecified: Secondary | ICD-10-CM | POA: Diagnosis present

## 2015-07-07 DIAGNOSIS — R001 Bradycardia, unspecified: Secondary | ICD-10-CM | POA: Diagnosis present

## 2015-07-07 DIAGNOSIS — N182 Chronic kidney disease, stage 2 (mild): Secondary | ICD-10-CM | POA: Diagnosis present

## 2015-07-07 DIAGNOSIS — E1122 Type 2 diabetes mellitus with diabetic chronic kidney disease: Secondary | ICD-10-CM | POA: Diagnosis present

## 2015-07-07 DIAGNOSIS — Z515 Encounter for palliative care: Secondary | ICD-10-CM | POA: Diagnosis present

## 2015-07-07 DIAGNOSIS — F17210 Nicotine dependence, cigarettes, uncomplicated: Secondary | ICD-10-CM | POA: Diagnosis present

## 2015-07-07 DIAGNOSIS — E669 Obesity, unspecified: Secondary | ICD-10-CM | POA: Diagnosis present

## 2015-07-07 DIAGNOSIS — J9601 Acute respiratory failure with hypoxia: Secondary | ICD-10-CM | POA: Diagnosis present

## 2015-07-07 DIAGNOSIS — G936 Cerebral edema: Secondary | ICD-10-CM | POA: Diagnosis present

## 2015-07-07 DIAGNOSIS — I480 Paroxysmal atrial fibrillation: Secondary | ICD-10-CM | POA: Diagnosis present

## 2015-07-07 DIAGNOSIS — I351 Nonrheumatic aortic (valve) insufficiency: Secondary | ICD-10-CM | POA: Diagnosis present

## 2015-07-07 DIAGNOSIS — E1140 Type 2 diabetes mellitus with diabetic neuropathy, unspecified: Secondary | ICD-10-CM | POA: Diagnosis present

## 2015-07-07 DIAGNOSIS — I10 Essential (primary) hypertension: Secondary | ICD-10-CM | POA: Diagnosis present

## 2015-07-07 DIAGNOSIS — Z66 Do not resuscitate: Secondary | ICD-10-CM | POA: Diagnosis present

## 2015-07-07 DIAGNOSIS — I159 Secondary hypertension, unspecified: Secondary | ICD-10-CM | POA: Diagnosis not present

## 2015-07-07 DIAGNOSIS — E785 Hyperlipidemia, unspecified: Secondary | ICD-10-CM | POA: Diagnosis present

## 2015-07-07 DIAGNOSIS — R4182 Altered mental status, unspecified: Secondary | ICD-10-CM | POA: Diagnosis present

## 2015-07-07 DIAGNOSIS — I629 Nontraumatic intracranial hemorrhage, unspecified: Secondary | ICD-10-CM

## 2015-07-07 LAB — CBC WITH DIFFERENTIAL/PLATELET
BASOS PCT: 1 %
Basophils Absolute: 0.1 10*3/uL (ref 0–0.1)
EOS PCT: 2 %
Eosinophils Absolute: 0.2 10*3/uL (ref 0–0.7)
HEMATOCRIT: 60.1 % — AB (ref 40.0–52.0)
Hemoglobin: 19.2 g/dL — ABNORMAL HIGH (ref 13.0–18.0)
LYMPHS ABS: 5.6 10*3/uL — AB (ref 1.0–3.6)
Lymphocytes Relative: 59 %
MCH: 31.8 pg (ref 26.0–34.0)
MCHC: 31.9 g/dL — ABNORMAL LOW (ref 32.0–36.0)
MCV: 99.9 fL (ref 80.0–100.0)
MONO ABS: 0.8 10*3/uL (ref 0.2–1.0)
MONOS PCT: 8 %
NEUTROS ABS: 2.9 10*3/uL (ref 1.4–6.5)
Neutrophils Relative %: 30 %
PLATELETS: 208 10*3/uL (ref 150–440)
RBC: 6.02 MIL/uL — ABNORMAL HIGH (ref 4.40–5.90)
RDW: 15.2 % — AB (ref 11.5–14.5)
WBC: 9.6 10*3/uL (ref 3.8–10.6)

## 2015-07-07 LAB — URINE DRUG SCREEN, QUALITATIVE (ARMC ONLY)
Amphetamines, Ur Screen: NOT DETECTED
BARBITURATES, UR SCREEN: NOT DETECTED
BENZODIAZEPINE, UR SCRN: NOT DETECTED
CANNABINOID 50 NG, UR ~~LOC~~: NOT DETECTED
COCAINE METABOLITE, UR ~~LOC~~: NOT DETECTED
MDMA (Ecstasy)Ur Screen: NOT DETECTED
Methadone Scn, Ur: NOT DETECTED
OPIATE, UR SCREEN: NOT DETECTED
PHENCYCLIDINE (PCP) UR S: NOT DETECTED
Tricyclic, Ur Screen: NOT DETECTED

## 2015-07-07 LAB — LIPASE, BLOOD: LIPASE: 31 U/L (ref 11–51)

## 2015-07-07 LAB — URINALYSIS COMPLETE WITH MICROSCOPIC (ARMC ONLY)
Bilirubin Urine: NEGATIVE
Glucose, UA: NEGATIVE mg/dL
Hgb urine dipstick: NEGATIVE
KETONES UR: NEGATIVE mg/dL
Leukocytes, UA: NEGATIVE
NITRITE: NEGATIVE
PH: 6 (ref 5.0–8.0)
PROTEIN: 100 mg/dL — AB
SPECIFIC GRAVITY, URINE: 1.012 (ref 1.005–1.030)

## 2015-07-07 LAB — TYPE AND SCREEN
ABO/RH(D): B POS
Antibody Screen: NEGATIVE

## 2015-07-07 LAB — GLUCOSE, CAPILLARY: Glucose-Capillary: 178 mg/dL — ABNORMAL HIGH (ref 65–99)

## 2015-07-07 LAB — COMPREHENSIVE METABOLIC PANEL
ALBUMIN: 3.9 g/dL (ref 3.5–5.0)
ALT: 21 U/L (ref 17–63)
AST: 28 U/L (ref 15–41)
Alkaline Phosphatase: 67 U/L (ref 38–126)
Anion gap: 9 (ref 5–15)
BUN: 12 mg/dL (ref 6–20)
CHLORIDE: 104 mmol/L (ref 101–111)
CO2: 27 mmol/L (ref 22–32)
CREATININE: 1.73 mg/dL — AB (ref 0.61–1.24)
Calcium: 8.6 mg/dL — ABNORMAL LOW (ref 8.9–10.3)
GFR calc Af Amer: 47 mL/min — ABNORMAL LOW (ref >60)
GFR, EST NON AFRICAN AMERICAN: 40 mL/min — AB (ref >60)
GLUCOSE: 177 mg/dL — AB (ref 65–99)
POTASSIUM: 4.8 mmol/L (ref 3.5–5.1)
SODIUM: 140 mmol/L (ref 135–145)
Total Bilirubin: 0.9 mg/dL (ref 0.3–1.2)
Total Protein: 7.2 g/dL (ref 6.5–8.1)

## 2015-07-07 LAB — LACTIC ACID, PLASMA: Lactic Acid, Venous: 1.1 mmol/L (ref 0.5–2.0)

## 2015-07-07 LAB — BRAIN NATRIURETIC PEPTIDE: B NATRIURETIC PEPTIDE 5: 201 pg/mL — AB (ref 0.0–100.0)

## 2015-07-07 LAB — PROTIME-INR
INR: 1.05
Prothrombin Time: 13.9 seconds (ref 11.4–15.0)

## 2015-07-07 LAB — T4, FREE: Free T4: 0.88 ng/dL (ref 0.61–1.12)

## 2015-07-07 LAB — TROPONIN I: Troponin I: 0.03 ng/mL (ref <0.031)

## 2015-07-07 LAB — ABO/RH: ABO/RH(D): B POS

## 2015-07-07 LAB — TSH: TSH: 3.244 u[IU]/mL (ref 0.350–4.500)

## 2015-07-07 LAB — APTT: APTT: 36 s (ref 24–36)

## 2015-07-07 MED ORDER — LORAZEPAM 2 MG/ML IJ SOLN: 1.0000 mg | INTRAMUSCULAR | Status: DC | PRN

## 2015-07-07 MED ORDER — HALOPERIDOL 0.5 MG PO TABS: 0.5000 mg | ORAL_TABLET | ORAL | Status: DC | PRN

## 2015-07-07 MED ORDER — MORPHINE SULFATE (PF) 2 MG/ML IV SOLN
2.0000 mg | Freq: Once | INTRAVENOUS | Status: AC
  Administered 2015-07-07: 2 mg via INTRAVENOUS
  Filled 2015-07-07: qty 1

## 2015-07-07 MED ORDER — SODIUM CHLORIDE 0.9 % IV BOLUS (SEPSIS)
1000.0000 mL | Freq: Once | INTRAVENOUS | Status: AC
  Administered 2015-07-07: 1000 mL via INTRAVENOUS

## 2015-07-07 MED ORDER — GLYCOPYRROLATE 0.2 MG/ML IJ SOLN: 0.2000 mg | INTRAMUSCULAR | Status: DC | PRN

## 2015-07-07 MED ORDER — LORAZEPAM 1 MG PO TABS: 1.0000 mg | ORAL_TABLET | ORAL | Status: DC | PRN

## 2015-07-07 MED ORDER — MORPHINE BOLUS VIA INFUSION: 4.0000 mg | INTRAVENOUS | Status: DC | PRN

## 2015-07-07 MED ORDER — ACETAMINOPHEN 650 MG RE SUPP: 650.0000 mg | Freq: Four times a day (QID) | RECTAL | Status: DC | PRN

## 2015-07-07 MED ORDER — PROPOFOL 10 MG/ML IV BOLUS
40.0000 mg | Freq: Once | INTRAVENOUS | Status: AC
  Administered 2015-07-07: 40 mg via INTRAVENOUS

## 2015-07-07 MED ORDER — HALOPERIDOL LACTATE 5 MG/ML IJ SOLN: 0.5000 mg | INTRAMUSCULAR | Status: DC | PRN

## 2015-07-07 MED ORDER — MORPHINE SULFATE 25 MG/ML IV SOLN: 1.0000 mg/h | INTRAVENOUS | Status: DC

## 2015-07-07 MED ORDER — LORAZEPAM 2 MG/ML PO CONC: 1.0000 mg | ORAL | Status: DC | PRN

## 2015-07-07 MED ORDER — ATROPINE SULFATE 1 MG/ML IJ SOLN
INTRAMUSCULAR | Status: AC | PRN
  Administered 2015-07-07 (×2): .5 mg via INTRAVENOUS

## 2015-07-07 MED ORDER — POLYVINYL ALCOHOL 1.4 % OP SOLN
1.0000 [drp] | Freq: Four times a day (QID) | OPHTHALMIC | Status: DC | PRN
Start: 2015-07-07 — End: 2015-07-07
  Filled 2015-07-07: qty 15

## 2015-07-07 MED ORDER — ACETAMINOPHEN 325 MG PO TABS: 650.0000 mg | ORAL_TABLET | Freq: Four times a day (QID) | ORAL | Status: DC | PRN

## 2015-07-07 MED ORDER — HALOPERIDOL LACTATE 2 MG/ML PO CONC
0.5000 mg | ORAL | Status: DC | PRN
  Filled 2015-07-07: qty 0.3

## 2015-07-07 MED ORDER — BIOTENE DRY MOUTH MT LIQD: 15.0000 mL | OROMUCOSAL | Status: DC | PRN

## 2015-07-07 MED ORDER — PROPOFOL 1000 MG/100ML IV EMUL
10.0000 ug/kg/min | Freq: Once | INTRAVENOUS | Status: AC
  Administered 2015-07-07: 10 ug/kg/min via INTRAVENOUS

## 2015-07-07 MED ORDER — ONDANSETRON 4 MG PO TBDP: 4.0000 mg | ORAL_TABLET | Freq: Four times a day (QID) | ORAL | Status: DC | PRN

## 2015-07-07 MED ORDER — ONDANSETRON HCL 4 MG/2ML IJ SOLN: 4.0000 mg | Freq: Four times a day (QID) | INTRAMUSCULAR | Status: DC | PRN

## 2015-07-07 MED ORDER — GLYCOPYRROLATE 1 MG PO TABS: 1.0000 mg | ORAL_TABLET | ORAL | Status: DC | PRN

## 2015-07-08 MED FILL — Medication: Qty: 1 | Status: AC

## 2015-07-09 LAB — URINE CULTURE: Culture: NO GROWTH

## 2015-07-12 LAB — CULTURE, BLOOD (ROUTINE X 2)
CULTURE: NO GROWTH
CULTURE: NO GROWTH

## 2015-07-13 LAB — BLOOD GAS, ARTERIAL
Acid-base deficit: 0.7 mmol/L (ref 0.0–2.0)
Allens test (pass/fail): POSITIVE — AB
Bicarbonate: 30.5 meq/L — ABNORMAL HIGH (ref 21.0–28.0)
FIO2: 1
MECHVT: 450 mL
O2 Saturation: 98.1 %
PEEP: 5 cmH2O
Patient temperature: 37
RATE: 18 {breaths}/min
pCO2 arterial: 78 mmHg (ref 32.0–48.0)
pH, Arterial: 7.2 — ABNORMAL LOW (ref 7.350–7.450)
pO2, Arterial: 126 mmHg — ABNORMAL HIGH (ref 83.0–108.0)

## 2015-07-17 ENCOUNTER — Ambulatory Visit: Payer: Medicare HMO | Admitting: Family

## 2015-07-21 NOTE — ED Notes (Addendum)
Family at bedside at this time, updated regarding pt's transfer status. Pt's family verbalized no further needs at this time

## 2015-07-21 NOTE — ED Notes (Signed)
Xray notified of repeat xray's placed.

## 2015-07-21 NOTE — ED Notes (Signed)
Pt transported to morgue at this time via orderly.

## 2015-07-21 NOTE — Code Documentation (Signed)
Pulses returned at this time, 98 NSR.

## 2015-07-21 NOTE — ED Notes (Signed)
Pt arrived to ED via EMS emergency traffic after pt was found unresponsive by family today. When EMS arrived to pt's home, pt was found slumped over in chair, drooling, unresponsive, and hypertensive at 240/120. Pt proceeded to seize, began to decorticate posture, stopped breathing, and lost pulses at approx 1158. CPR began immediatly, pulses returned with no meds or shocks delivered. Pt arrived to ED with king airway in place, IO in right leg, HR 92 NSR, hypertensive at 185/118. MD Edd Fabian and RT at bedside placing ET tube upon arrival.

## 2015-07-21 NOTE — ED Provider Notes (Addendum)
[facility] Emergency Department Provider Note  ____________________________________________  Time seen: On EMS arrival  I have reviewed the triage vital signs and the nursing notes.   HISTORY  Chief Complaint Altered Mental Status; Respiratory Arrest; and Cardiac Arrest  Caveat-history of present illness and review of systems limited due to the patient's severity of illness. Information obtained from EMS on their arrival.  HPI LOGEN KYNE is a 65 y.o. male with history of CHF, coronary artery disease, atrial flutter, history of ascending aortic aneurysm, hypertension and hyperlipidemia who presents for evaluation of decreased mental status and cardiac arrest. According to EMS, the patient complained earlier in the day of "not feeling well". He refused to go to the ER earlier. His family members finally called the ambulance and on their arrival he was slumped over in a chair, unresponsive. A King airway was inserted. He briefly lost pulses requiring CPR only, no epinephrine or other medications were given. Her EMS, he had one episode of vomiting after king airway was placed.   Past Medical History  Diagnosis Date  . CHF (congestive heart failure) (HCC)     a. previously systolic, now diastolic.  . Atrial flutter ([place])     a. 12/2008 s/p Aflutter RFCA.  Marland Kitchen Coronary artery disease     a. 12/2008 Cath: nonobs CAD.  Marland Kitchen Aortic insufficiency     a. 08/2012 Echo: Mild AI.  Marland Kitchen Ascending aortic aneurysm (HCC)     a. moderately dilated to 4.6 cm at the Sinuses of Valsalva. ascending aorta was mildly dilated to 4.2 cm - stable.  . Hypertension   . Hyperlipidemia   . Tobacco abuse   . Chronic bronchitis ([place])   . Cardiomyopathy ([place])     a. Presumed to have been tachycardia mediated - 10/2008 Echo: EF 15%;  b. 08/2012 Echo: EF 60-65%, mod LVH, mild AI, mod bi-atrial enlargement.  Marland Kitchen PAF (paroxysmal atrial fibrillation) ([place])   . CKD (chronic kidney disease), stage II   .  COPD (chronic obstructive pulmonary disease) ([place])   . Shortness of breath dyspnea     Patient Active Problem List   Diagnosis Date Noted  . COPD (chronic obstructive pulmonary disease) ([employer])   . CKD (chronic kidney disease), stage II   . Coronary artery disease   . CHF (congestive heart failure) ([place])   . Hyperlipidemia   . ATRIAL FLUTTER 03/07/2009  . OBESITY NOS 10/27/2006  . NICOTINE ADDICTION 10/27/2006  . Essential hypertension 10/27/2006    Past Surgical History  Procedure Laterality Date  . Esophagogastroduodenoscopy  10/2002    H. Pylori via biopsy Nicolasa Ducking)  . Colonoscopy w/ polypectomy  10/2002    Nicolasa Ducking  . Ct abd w & pelvis wo cm  09/29/2006    Chest w/ nml  . Ct thoracic spine  09/29/2006    w/ nml  . Spect ett  06/15/2007    Mod inf defect w/o ischemia depr LVF (Dr Clayborn Bigness)  . Tee without cardioversion  11/14/2008    LVE EF 15% Diffuse hypokinesis mild-mod AR LAE RAE mod TR  . Cardiac catheterization  01/11/2009    Mild- mod AI, EF 55% 30-40% stenosis first diag LAD  . Colonoscopy with propofol N/A 05/16/2015    Procedure: COLONOSCOPY WITH PROPOFOL;  Surgeon: Josefine Class, MD;  Location: [facility] ENDOSCOPY;  Service: Endoscopy;  Laterality: N/A;    Current Outpatient Rx  Name  Route  Sig  Dispense  Refill  . acetaminophen (TYLENOL) 500 MG  tablet   Oral   Take 500 mg by mouth every 6 (six) hours as needed.         Marland Kitchen aspirin 81 MG EC tablet   Oral   Take 81 mg by mouth daily.           Marland Kitchen atorvastatin (LIPITOR) 20 MG tablet   Oral   Take 1 tablet (20 mg total) by mouth daily.   30 tablet   5   . buPROPion (WELLBUTRIN SR) 150 MG 12 hr tablet   Oral   Take 150 mg by mouth 2 (two) times daily.         . carvedilol (COREG) 25 MG tablet   Oral   Take 50 mg by mouth 2 (two) times daily with a meal.         . cetirizine (ZYRTEC) 10 MG tablet   Oral   Take 10 mg by mouth daily.         . fish oil-omega-3 fatty acids 1000 MG capsule    Oral   Take 2 g by mouth daily.         . fluticasone (FLONASE) 50 MCG/ACT nasal spray   Each Nare   Place into both nostrils daily.         . Fluticasone-Salmeterol (ADVAIR) 250-50 MCG/DOSE AEPB   Inhalation   Inhale 1 puff into the lungs every 12 (twelve) hours.         . furosemide (LASIX) 40 MG tablet   Oral   Take 40 mg by mouth daily.    1 tablet   0   . Ipratropium-Albuterol (COMBIVENT) 20-100 MCG/ACT AERS respimat   Inhalation   Inhale 1 puff into the lungs every 6 (six) hours.         . isosorbide-hydrALAZINE (BIDIL) 20-37.5 MG per tablet   Oral   Take 1 tablet by mouth 3 (three) times daily.         . nitroGLYCERIN (NITROSTAT) 0.4 MG SL tablet   Sublingual   Place 0.4 mg under the tongue every 5 (five) minutes as needed.           . sacubitril-valsartan (ENTRESTO) 49-51 MG   Oral   Take 1 tablet by mouth 2 (two) times daily.   60 tablet   5     Allergies Review of patient's allergies indicates no known allergies.  Family History  Problem Relation Age of Onset  . Heart failure Father   . Hypertension Father   . Atrial fibrillation Brother   . Arrhythmia Brother     A fib, cardiomeg has defibrillator  . Kidney failure Brother     declined dialysis  . Kidney disease Brother     Kidney failure, declined dialysis  . Diabetes Neg Hx   . Heart attack Neg Hx   . Breast cancer Neg Hx   . Ovarian cancer Neg Hx   . Uterine cancer Neg Hx   . Alcohol abuse Neg Hx   . Depression Neg Hx   . Stroke Neg Hx   . Aneurysm Mother 33    brain  . Heart disease Brother   . Heart disease Brother     Social History Social History  Substance Use Topics  . Smoking status: Current Every Day Smoker -- 0.25 packs/day for 25 years    Types: Cigarettes  . Smokeless tobacco: Never Used     Comment: Recently decreased to 7 cigs/week  . Alcohol Use: No  Review of Systems  Caveat-history of present illness and review of systems limited due to the  patient's severity of illness. Information obtained from EMS on their arrival. ____________________________________________   PHYSICAL EXAM:  VITAL SIGNS: ED Triage Vitals  Enc Vitals Group     BP 2015-07-08 1256 185/118 mmHg     Pulse Rate 07/08/15 1256 92     Resp Jul 08, 2015 1256 12     Temp July 08, 2015 1256 100.4 F (38 C)     Temp Source Jul 08, 2015 1256 Rectal     SpO2 07-08-15 1256 94 %     Weight July 08, 2015 1256 246 lb 14.6 oz (112 kg)     Height 07-08-2015 1256 6' (1.829 m)     Head Cir --      Peak Flow --      Pain Score --      Pain Loc --      Pain Edu? --      Excl. in [place]? --     Constitutional: King airway in place, no response to noxious stimuli in any extremity. + cough/gag reflex Eyes: Conjunctivae are normal. Pupils 1-2 mm and nonreactive bilaterally. Head: Atraumatic. Nose: No congestion/rhinnorhea. Mouth/Throat: King airway in place. Neck: No stridor.  Cardiovascular: Normal rate, regular rhythm. Grossly normal heart sounds.  Respiratory: Ventilated breath sounds bilaterally. Gastrointestinal: Stented with hypoactive bowel sounds. Genitourinary: deferred Musculoskeletal: No lower extremity tenderness nor edema.   Neurologic: GCS 3. No response to pain in any extremity. Skin:  Skin is warm, dry and intact. No rash noted.   ____________________________________________   LABS (all labs ordered are listed, but only abnormal results are displayed)  Labs Reviewed  COMPREHENSIVE METABOLIC PANEL - Abnormal; Notable for the following:    Glucose, Bld 177 (*)    Creatinine, Ser 1.73 (*)    Calcium 8.6 (*)    GFR calc non Af Amer 40 (*)    GFR calc Af Amer 47 (*)    All other components within normal limits  CBC WITH DIFFERENTIAL/PLATELET - Abnormal; Notable for the following:    RBC 6.02 (*)    Hemoglobin 19.2 (*)    HCT 60.1 (*)    MCHC 31.9 (*)    RDW 15.2 (*)    Lymphs Abs 5.6 (*)    All other components within normal limits  BLOOD GAS, ARTERIAL -  Abnormal; Notable for the following:    pH, Arterial 7.20 (*)    pCO2 arterial 78 (*)    pO2, Arterial 126 (*)    Bicarbonate 30.5 (*)    Allens test (pass/fail) POSITIVE (*)    All other components within normal limits  URINALYSIS COMPLETEWITH MICROSCOPIC ([facility] ONLY) - Abnormal; Notable for the following:    Color, Urine YELLOW (*)    APPearance CLOUDY (*)    Protein, ur 100 (*)    Bacteria, UA RARE (*)    Squamous Epithelial / LPF 0-5 (*)    All other components within normal limits  GLUCOSE, CAPILLARY - Abnormal; Notable for the following:    Glucose-Capillary 178 (*)    All other components within normal limits  BRAIN NATRIURETIC PEPTIDE - Abnormal; Notable for the following:    B Natriuretic Peptide 201.0 (*)    All other components within normal limits  CULTURE, BLOOD (ROUTINE X 2)  CULTURE, BLOOD (ROUTINE X 2)  URINE CULTURE  LIPASE, BLOOD  TROPONIN I  APTT  PROTIME-INR  TSH  T4, FREE  URINE DRUG SCREEN, QUALITATIVE ([facility] ONLY)  LACTIC ACID, PLASMA  LACTIC ACID, PLASMA  TYPE AND SCREEN  TYPE AND SCREEN  ABO/RH   ____________________________________________  EKG  ED ECG REPORT I, Joanne Gavel, the attending physician, personally viewed and interpreted this ECG.   Date: 2015-07-16  EKG Time: 12:13  Rate: 94  Rhythm: normal sinus rhythm  Axis: normal  Intervals:left anterior fascicular block  ST&T Change: LVH with repolarization.   ____________________________________________  RADIOLOGY  CT head IMPRESSION: 1. Large rounded collection of acute hemorrhage centered within the pons, with upwards extension to the midbrain, measuring 4.4 x 2.9 cm at the level of the pons. Probable associated edema within the pons and midbrain. This hemorrhage and edema is suspected to be related to hemorrhagic infarction or hypertensive hemorrhage. Alternative considerations include vascular malformation and ruptured aneurysm and would, therefore, consider further  characterization with CT angiogram or MR angiogram. 2. Associated hemorrhage extending into the fourth ventricle, third ventricle and lateral ventricles. No associated hydrocephalus. 3. No midline shift or herniation. 4. After further review, there is an additional tiny focus of acute hemorrhage also noted within the left thalamus.  CXR FINDINGS: Moderate cardiomegaly. Vascular congestion. No sign of pulmonary edema. No pneumothorax. NG tube is in place. It becomes obscured over the lower mediastinum. Endotracheal tube tip is 11.5 cm from the carina.  IMPRESSION: Cardiomegaly and vascular congestion.  Endotracheal and NG tubes as described.   CXR FINDINGS: Cardiac shadow is mildly enlarged but stable. Vascular congestion is again seen. An endotracheal tube and nasogastric catheter are again seen. The endotracheal tube is been advanced and now lies 5.5 cm above the carina at the level of the aortic arch.  IMPRESSION: Stable vascular congestion.  Tubes and lines as described.   Abd xray IMPRESSION: Tip of OG tube lies over the distal esophageal region-recommend advancement.  Gastric distention.  Abd xray  IMPRESSION: Questionable visualization of tip of nasogastric tube projecting over stomach.  Resolution of previously identified gaseous distention of the stomach. ____________________________________________   PROCEDURES  Procedure(s) performed:  INTUBATION Performed by: Loura Pardon A  Required items: required blood products, implants, devices, and special equipment available Patient identity confirmed: provided demographic data and hospital-assigned identification number Time out: Immediately prior to procedure a "time out" was called to verify the correct patient, procedure, equipment, support staff and site/side marked as required.  Indications: airway protection  Intubation method: Glidescope Laryngoscopy   Preoxygenation: Edison Pace  airway  Sedatives: none Paralytic: none  Tube Size: 7.5 cuffed  Post-procedure assessment: chest rise and ETCO2 monitor Breath sounds: equal and absent over the epigastrium Tube secured with: ETT holder Chest x-ray interpreted by radiologist and me.  Chest x-ray findings: endotracheal tube in appropriate position  Patient tolerated the procedure well with no immediate complications.    Critical Care performed: Yes, see critical care note(s). 120 minutes  ____________________________________________   INITIAL IMPRESSION / ASSESSMENT AND PLAN / ED COURSE  Pertinent labs & imaging results that were available during my care of the patient were reviewed by me and considered in my medical decision making (see chart for details).  FITZWILLIAM TEEPLES is a 65 y.o. male with history of CHF, coronary artery disease, atrial flutter, history of ascending aortic aneurysm, hypertension and hyperlipidemia who presents for evaluation of decreased mental status and cardiac arrest. On arrival to the emergency department, the patient has pupils 1-2 mm which are fixed, nonreactive. He has no response to noxious stimulus in any extremity. King airway replaced with 7.5 cm endotracheal tube.  Copious the emesis was noted. CT head concerning for acute pontine/brain hemorrhage. Will discuss case with [facility] and intensive care.  ----------------------------------------- 2:25 PM on July 29, 2015 ----------------------------------------- I was called to the bedside due to the patient's noted bradycardia on monitor. He lost pulses, briefly received CPR for less than a minute and received atropine after which he regained pulses. I discussed the case with Dr. Saintclair Halsted of neurosurgery at [facility] who reports that this is inoperable and nonsurvivable. He recommends discussion with [facility] intensivist for admission if the patient remains full code. I discussed this with his family, he remains full code, we'll discuss the case  with intensivist at [facility] for transfer.  ----------------------------------------- 4:40 PM on July 29, 2015 -----------------------------------------  After paging [facility] intensivist repeatedly for over an hour and a half, had yet to receive a call back for a physician to physician conversation therefore I discussed the case with Dr. Zara Council of [facility] neurology who has accepted the patient as transfer.  ----------------------------------------- 5:00 PM on 07/29/15 ----------------------------------------- Dr. Stevenson Clinch of [facility] ICU has seen the patient, discussed prognosis with the family. They have elected to make him comfort care. He will be extubated and admitted here at [facility].  ____________________________________________   FINAL CLINICAL IMPRESSION(S) / ED DIAGNOSES  Final diagnoses:  Intracranial hemorrhage ([place])  Cardiac arrest ([place])      Joanne Gavel, MD 2015-07-29 1706  Joanne Gavel, MD 07/29/2015 1708

## 2015-07-21 NOTE — ED Notes (Addendum)
Pt had 11 run episode of Vtach, EKG printed out and handed to MD [facility]. No new orders given at this time.

## 2015-07-21 NOTE — Consult Note (Signed)
PULMONARY / CRITICAL CARE MEDICINE   Name: Bobby Gray MRN: [id number] DOB: [birth year]    ADMISSION DATE:  11-Jul-2015 CONSULTATION DATE:  07-11-15  REFERRING MD :  Dr. Edd Fabian [facility] ED)   CHIEF COMPLAINT:     Brainstem Bleed Reason - initiate comfort care talk   HISTORY OF PRESENT ILLNESS    65 year old male past medical history of hypertension, diabetes, COPD, neuropathy coronary artery disease seen in consultation for initiating comfort care talks for a large brainstem bleed. Per report family follow patient at home alone, unresponsive for unknown amount of time sitting in chair at that point EMS was called. Upon arrival to the ED patient unresponsive emergently intubated, started CT showed large brainstem bleed. ED physician for spoken with neurosurgery at [facility] and at [facility], physicians there and stated that this wasn't an operable nonsurvivable hemorrhagic event. 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 I have spoken with the family and showed them the films explained neurology, neurosurgery, and intensivists findings and poor prognosis for this patient. Family very understanding of time, decided to initiate comfort care measures with the patient. CT scan shows large hemorrhagic area with edema and early herniation in the area of the pontine.   SIGNIFICANT EVENTS      PAST MEDICAL HISTORY    :  Past Medical History  Diagnosis Date  . CHF (congestive heart failure) (HCC)     a. previously systolic, now diastolic.  . Atrial flutter ([place])     a. 12/2008 s/p Aflutter RFCA.  Marland Kitchen Coronary artery disease     a. 12/2008 Cath: nonobs CAD.  Marland Kitchen Aortic insufficiency     a. 08/2012 Echo: Mild AI.  Marland Kitchen Ascending aortic aneurysm (HCC)     a. moderately dilated to 4.6 cm at the Sinuses of Valsalva. ascending aorta was  mildly dilated to 4.2 cm - stable.  . Hypertension   . Hyperlipidemia   . Tobacco abuse   . Chronic bronchitis ([place])   . Cardiomyopathy ([place])     a. Presumed to have been tachycardia mediated - 10/2008 Echo: EF 15%;  b. 08/2012 Echo: EF 60-65%, mod LVH, mild AI, mod bi-atrial enlargement.  Marland Kitchen PAF (paroxysmal atrial fibrillation) ([place])   . CKD (chronic kidney disease), stage II   . COPD (chronic obstructive pulmonary disease) ([place])   . Shortness of breath dyspnea    Past Surgical History  Procedure Laterality Date  . Esophagogastroduodenoscopy  10/2002    H. Pylori via biopsy Nicolasa Ducking)  . Colonoscopy w/ polypectomy  10/2002    Nicolasa Ducking  . Ct abd w & pelvis wo cm  09/29/2006    Chest w/ nml  . Ct thoracic spine  09/29/2006    w/ nml  . Spect ett  06/15/2007    Mod inf defect w/o ischemia depr LVF (Dr Clayborn Bigness)  . Tee without cardioversion  11/14/2008    LVE EF 15% Diffuse hypokinesis mild-mod AR LAE RAE mod TR  . Cardiac catheterization  01/11/2009    Mild- mod AI, EF 55% 30-40% stenosis first diag LAD  . Colonoscopy with propofol N/A 05/16/2015    Procedure: COLONOSCOPY WITH PROPOFOL;  Surgeon: Josefine Class, MD;  Location: [facility] ENDOSCOPY;  Service: Endoscopy;  Laterality: N/A;   Prior to Admission medications   Medication Sig Start Date End Date Taking? Authorizing Provider  acetaminophen (TYLENOL) 500 MG tablet Take 500 mg by mouth every 6 (six) hours as needed.    Historical Provider, MD  aspirin 81 MG EC tablet Take 81 mg by mouth daily.      Historical Provider, MD  atorvastatin (LIPITOR) 20 MG tablet Take 1 tablet (20 mg total) by mouth daily. 03/28/15   Alisa Graff, FNP  buPROPion (WELLBUTRIN SR) 150 MG 12 hr tablet Take 150 mg by mouth 2 (two) times daily.    Historical Provider, MD  carvedilol (COREG) 25 MG tablet Take 50 mg by mouth 2 (two) times daily with a meal.    Historical Provider, MD  cetirizine (ZYRTEC) 10 MG tablet Take 10 mg by mouth daily.    Historical  Provider, MD  fish oil-omega-3 fatty acids 1000 MG capsule Take 2 g by mouth daily.    Historical Provider, MD  fluticasone (FLONASE) 50 MCG/ACT nasal spray Place into both nostrils daily.    Historical Provider, MD  Fluticasone-Salmeterol (ADVAIR) 250-50 MCG/DOSE AEPB Inhale 1 puff into the lungs every 12 (twelve) hours.    Historical Provider, MD  furosemide (LASIX) 40 MG tablet Take 40 mg by mouth daily.  12/04/11   Larey Dresser, MD  Ipratropium-Albuterol (COMBIVENT) 20-100 MCG/ACT AERS respimat Inhale 1 puff into the lungs every 6 (six) hours.    Historical Provider, MD  isosorbide-hydrALAZINE (BIDIL) 20-37.5 MG per tablet Take 1 tablet by mouth 3 (three) times daily. 05/17/14   Alisa Graff, FNP  nitroGLYCERIN (NITROSTAT) 0.4 MG SL tablet Place 0.4 mg under the tongue every 5 (five) minutes as needed.      Historical Provider, MD  sacubitril-valsartan (ENTRESTO) 49-51 MG Take 1 tablet by mouth 2 (two) times daily. 06/12/15   Alisa Graff, FNP   No Known Allergies   FAMILY HISTORY   Family History  Problem Relation Age of Onset  . Heart failure Father   . Hypertension Father   . Atrial fibrillation Brother   . Arrhythmia Brother     A fib, cardiomeg has defibrillator  . Kidney failure Brother     declined dialysis  . Kidney disease Brother     Kidney failure, declined dialysis  . Diabetes Neg Hx   . Heart attack Neg Hx   . Breast cancer Neg Hx   . Ovarian cancer Neg Hx   . Uterine cancer Neg Hx   . Alcohol abuse Neg Hx   . Depression Neg Hx   . Stroke Neg Hx   . Aneurysm Mother 86    brain  . Heart disease Brother   . Heart disease Brother       SOCIAL HISTORY    reports that he has been smoking Cigarettes.  He has a 6.25 pack-year smoking history. He has never used smokeless tobacco. He reports that he does not drink alcohol or use illicit drugs.  Review of Systems  Unable to perform ROS: critical illness      VITAL SIGNS    Temp:  [100.4 F (38 C)]  100.4 F (38 C) (12/18 1256) Pulse Rate:  [41-95] 62 (12/18 1647) Resp:  [12-24] 24 (12/18 1647) BP: (116-191)/(75-120) 126/80 mmHg (12/18 1647) SpO2:  [94 %-100 %] 100 % (12/18 1647) FiO2 (%):  [100 %] 100 % (12/18 1300) Weight:  [246 lb 14.6 oz (112 kg)] 246 lb 14.6 oz (112 kg) (12/18 1256) HEMODYNAMICS:   VENTILATOR SETTINGS: Vent Mode:  [-] AC FiO2 (%):  [100 %] 100 % Set  Rate:  [18 bmp] 18 bmp Vt Set:  [450 mL] 450 mL PEEP:  [5 cmH20] 5 cmH20 INTAKE / OUTPUT:  Intake/Output Summary (Last 24 hours) at 07-17-15 1658 Last data filed at 2015/07/17 1230  Gross per 24 hour  Intake      0 ml  Output     35 ml  Net    -35 ml       PHYSICAL EXAM   Physical Exam  Constitutional: He appears well-developed and well-nourished.  On ventilator, nonresponsive  HENT:  Head: Normocephalic and atraumatic.  Right Ear: External ear normal.  Left Ear: External ear normal.  Pupils dilated at +4 mm  Neck: No tracheal deviation present.  Cardiovascular: Normal rate, regular rhythm and normal heart sounds.   No murmur heard. Pulmonary/Chest: He has no wheezes.  On the ventilator, shallow breathing no crackles, no rales  Abdominal: He exhibits no distension.  Musculoskeletal: He exhibits no edema.  Neurological:  Intubated, no response to pain or corneal reflex  Skin: Skin is warm. No rash noted. No erythema. No pallor.  Diaphoretic  Nursing note and vitals reviewed.      LABS   LABS:  CBC  Recent Labs Lab 07-17-15 1218  WBC 9.6  HGB 19.2*  HCT 60.1*  PLT 208   Coag's  Recent Labs Lab 2015-07-17 1218  APTT 36  INR 1.05   BMET  Recent Labs Lab Jul 17, 2015 1218  NA 140  K 4.8  CL 104  CO2 27  BUN 12  CREATININE 1.73*  GLUCOSE 177*   Electrolytes  Recent Labs Lab Jul 17, 2015 1218  CALCIUM 8.6*   Sepsis Markers  Recent Labs Lab 07-17-15 1432  LATICACIDVEN 1.1   ABG  Recent Labs Lab 07/17/15 1315  PHART 7.20*  PCO2ART 78*  PO2ART 126*    Liver Enzymes  Recent Labs Lab 07/17/15 1218  AST 28  ALT 21  ALKPHOS 67  BILITOT 0.9  ALBUMIN 3.9   Cardiac Enzymes  Recent Labs Lab 07-17-2015 1218  TROPONINI 0.03   Glucose  Recent Labs Lab 07-17-15 1223  GLUCAP 178*     No results found for this or any previous visit (from the past 240 hour(s)).  No current facility-administered medications for this encounter.  Current outpatient prescriptions:  .  acetaminophen (TYLENOL) 500 MG tablet, Take 500 mg by mouth every 6 (six) hours as needed., Disp: , Rfl:  .  aspirin 81 MG EC tablet, Take 81 mg by mouth daily.  , Disp: , Rfl:  .  atorvastatin (LIPITOR) 20 MG tablet, Take 1 tablet (20 mg total) by mouth daily., Disp: 30 tablet, Rfl: 5 .  buPROPion (WELLBUTRIN SR) 150 MG 12 hr tablet, Take 150 mg by mouth 2 (two) times daily., Disp: , Rfl:  .  carvedilol (COREG) 25 MG tablet, Take 50 mg by mouth 2 (two) times daily with a meal., Disp: , Rfl:  .  cetirizine (ZYRTEC) 10 MG tablet, Take 10 mg by mouth daily., Disp: , Rfl:  .  fish oil-omega-3 fatty acids 1000 MG capsule, Take 2 g by mouth daily., Disp: , Rfl:  .  fluticasone (FLONASE) 50 MCG/ACT nasal spray, Place into both nostrils daily., Disp: , Rfl:  .  Fluticasone-Salmeterol (ADVAIR) 250-50 MCG/DOSE AEPB, Inhale 1 puff into the lungs every 12 (twelve) hours., Disp: , Rfl:  .  furosemide (LASIX) 40 MG tablet, Take 40 mg by mouth daily. , Disp: 1 tablet, Rfl: 0 .  Ipratropium-Albuterol (COMBIVENT) 20-100 MCG/ACT AERS  respimat, Inhale 1 puff into the lungs every 6 (six) hours., Disp: , Rfl:  .  isosorbide-hydrALAZINE (BIDIL) 20-37.5 MG per tablet, Take 1 tablet by mouth 3 (three) times daily., Disp: , Rfl:  .  nitroGLYCERIN (NITROSTAT) 0.4 MG SL tablet, Place 0.4 mg under the tongue every 5 (five) minutes as needed.  , Disp: , Rfl:  .  sacubitril-valsartan (ENTRESTO) 49-51 MG, Take 1 tablet by mouth 2 (two) times daily., Disp: 60 tablet, Rfl: 5  IMAGING    Dg Abd 1  View  07/31/15  CLINICAL DATA:  Intubation, orogastric tube adjustment EXAM: ABDOMEN - 1 VIEW COMPARISON:  Portable exam 1416 hours compared to 31-Jul-2015 at 1236 hours FINDINGS: External pacing leads project over chest. Nasogastric tube is not well visualized. Questionable visualization of nasogastric tip projecting over stomach. Bowel gas pattern normal. Gaseous distention of stomach seen on previous exam no longer identified. IMPRESSION: Questionable visualization of tip of nasogastric tube projecting over stomach. Resolution of previously identified gaseous distention of the stomach. Electronically Signed   By: Lavonia Dana M.D.   On: 07-31-15 14:36   Dg Abd 1 View  31-Jul-2015  CLINICAL DATA:  65 year old male with OG tube placement. EXAM: ABDOMEN - 1 VIEW COMPARISON:  None. FINDINGS: The tip of an OG tube is identified in the region of the distal esophagus -recommend advancement. Mild gaseous distension of the stomach is noted. IMPRESSION: Tip of OG tube lies over the distal esophageal region-recommend advancement. Gastric distention. Electronically Signed   By: Margarette Canada M.D.   On: 31-Jul-2015 12:53   Ct Head Wo Contrast  2015-07-31  CLINICAL DATA:  Found down at home. EXAM: CT HEAD WITHOUT CONTRAST TECHNIQUE: Contiguous axial images were obtained from the base of the skull through the vertex without intravenous contrast. COMPARISON:  None. FINDINGS: Large rounded collection of acute hemorrhage centered within the pons measures 4.4 x 2.9 cm, with upwards extension into the midbrain. Suspect associated edema within the pons and midbrain. This acute hemorrhage also extends posteriorly into the fourth ventricle. Additional smaller amount of hemorrhage is present within the third ventricle and lateral ventricles bilaterally. No associated hydrocephalus. No other site of parenchymal hemorrhage identified. No epidural or subdural hemorrhage. Mild chronic small vessel ischemic changes are seen within  the bilateral periventricular and subcortical white matter regions. No supratentorial edema or mass effect identified. No midline shift or herniation. No osseous fracture or dislocation. Superficial soft tissues are unremarkable. IMPRESSION: 1. Large rounded collection of acute hemorrhage centered within the pons, with upwards extension to the midbrain, measuring 4.4 x 2.9 cm at the level of the pons. Probable associated edema within the pons and midbrain. This hemorrhage and edema is suspected to be related to hemorrhagic infarction or hypertensive hemorrhage. Alternative considerations include vascular malformation and ruptured aneurysm and would, therefore, consider further characterization with CT angiogram or MR angiogram. 2. Associated hemorrhage extending into the fourth ventricle, third ventricle and lateral ventricles. No associated hydrocephalus. 3. No midline shift or herniation. 4. After further review, there is an additional tiny focus of acute hemorrhage also noted within the left thalamus. Critical Value/emergent results were discussed by telephone at the time of interpretation on 2015-07-31 at 1:30 pm with Dr. Loura Pardon , who verbally acknowledged these results. Electronically Signed   By: Franki Cabot M.D.   On: 07/31/2015 13:36   Dg Chest Portable 1 View  07/31/15  CLINICAL DATA:  Check endotracheal tube and gastric catheter placement EXAM: PORTABLE CHEST - 1  VIEW COMPARISON:  Jul 21, 2015 FINDINGS: Cardiac shadow is mildly enlarged but stable. Vascular congestion is again seen. An endotracheal tube and nasogastric catheter are again seen. The endotracheal tube is been advanced and now lies 5.5 cm above the carina at the level of the aortic arch. IMPRESSION: Stable vascular congestion. Tubes and lines as described. Electronically Signed   By: Inez Catalina M.D.   On: 07-21-2015 14:36   Dg Chest Portable 1 View  07/21/15  CLINICAL DATA:  Unresponsive EXAM: PORTABLE CHEST 1 VIEW  COMPARISON:  03/08/2014 FINDINGS: Moderate cardiomegaly. Vascular congestion. No sign of pulmonary edema. No pneumothorax. NG tube is in place. It becomes obscured over the lower mediastinum. Endotracheal tube tip is 11.5 cm from the carina. IMPRESSION: Cardiomegaly and vascular congestion. Endotracheal and NG tubes as described. Electronically Signed   By: Marybelle Killings M.D.   On: 07/21/15 12:54      Indwelling Urinary Catheter continued, requirement due to   Reason to continue Indwelling Urinary Catheter for strict Intake/Output monitoring for hemodynamic instability   Central Line continued, requirement due to   Reason to continue [employer] Monitoring of central venous pressure or other hemodynamic parameters   Ventilator continued, requirement due to, resp failure    Ventilator Sedation RASS 0 to -2   Cultures: BCx2  UC  Sputum  Antibiotics:  Lines:   ASSESSMENT/PLAN    65 year old male past medical history of hypertension, COPD, coronary vascular disease, myopathy, heart failure, with 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  Brainstem hemorrhagic infarct -Neurosurgery and neurology has stated that this is inoperable and approachable and nonsurvivable bleed. This was conveyed to the family, images also shown to the family, and family has decided on pursuing comfort care measures and extubation. I have relayed other specialists impression and poor prognostic outcome to the family, and they have appreciated input.  -Recommend pursuing comfort care measures as requested by family, hospitalist services to admit and follow up; appreciate assistance from all services.   Critical Care Time - 24mins  Thank you for consulting [facility], we will signoff at this time.  Please feel free to contact us with any questions at [phone] (please enter 7-digits).    Vilinda Boehringer, MD [facility] Pager [phone] (please enter 7-digits) On Call Pager [phone] (please enter 7-digits)     07-21-2015, 4:58 PM  Note: This note was prepared with Dragon dictation along with smaller phrase technology. Any transcriptional errors that result from this process are unintentional.

## 2015-07-21 NOTE — Progress Notes (Signed)
   July 19, 2015 1800  Clinical Encounter Type  Visited With Patient and family together  Visit Type Spiritual support;Critical Care  Referral From Nurse  Consult/Referral To Chaplain  Spiritual Encounters  Spiritual Needs Prayer  Received order to unit.  Was informed by family that pt was end of life.  Family member informed me that they were waiting on other family members before removing life support.  Provided pastoral presence, support and prayer to pt's wife and other family members in pt's room.  [place] [phone]

## 2015-07-21 NOTE — Discharge Summary (Signed)
[place] at [facility]    Death Note - please see Last Note for all details.  PATIENT NAME: Bobby Gray    MR#:  [id number]  DATE OF BIRTH:  [birth year]  DATE OF ADMISSION:  07-18-15  PRIMARY CARE PHYSICIAN: Volanda Napoleon, MD   ADMISSION DIAGNOSIS:  Unresponsive  HISTORY OF PRESENT ILLNESS ON ADMISSION:  Bobby Gray is a 65 y.o. male who presents with respiratory arrest and unresponsive state. Patient was found at home for unknown amount of time unresponsive. He was brought in to the ED. Had acute respiratory failure requiring intubation. CT head quickly revealed intracranial hemorrhage near his brainstem. Neurosurgery was called and stated that his articular hemorrhage is not treatable and unsurvivable. Critical care was consulted, family opted for comfort measures. Hospitalists were called for admission.  HOSPITAL COURSE:  Patient was extubated and expired shortly thereafter.    Pronounced dead by Modena Nunnery, RN on @TODAY @ at 19:05   Cause of death: Intracranial Hemorrhage   Tashika Goodin FIELDING 2015/07/18, 7:34 PM  [place] [facility] [place]  Total clinical and documentation time for today: <30 minutes

## 2015-07-21 NOTE — Progress Notes (Signed)
   07-20-15 1230  Clinical Encounter Type  Visited With Family  Visit Type Code  Referral From Nurse  Consult/Referral To Chaplain  Spiritual Encounters  Spiritual Needs Prayer  Responded to Code [facility] page from nursing staff.  Pt's family in consult room.  Provided pastoral care, support and prayer to them.  ED doctor to come in and provide more information.  Sat with family for around an hour.  Family said they are ok and will call if they need chaplain again.  No other needs identified.  [place] [phone]

## 2015-07-21 NOTE — ED Notes (Signed)
MD Gayle at bedside. 

## 2015-07-21 NOTE — ED Notes (Addendum)
[place] [facility] called at this time, RN spoke to [employer], pt referral # [id number].

## 2015-07-21 NOTE — ED Notes (Signed)
Patient extubated. Family at bedside. Propofol stopped. Will continue with comfort measures.

## 2015-07-21 NOTE — ED Notes (Addendum)
RT at bedside adjusting vent settings. Changing rate to 24

## 2015-07-21 NOTE — H&P (Addendum)
Bobby Gray NAME: Bobby Gray    MR#:  [id number]  DATE OF BIRTH:  18-Jan-1951  DATE OF ADMISSION:  07-28-2015  PRIMARY CARE PHYSICIAN: Volanda Napoleon, MD   REQUESTING/REFERRING PHYSICIAN: Edd Fabian, MD  CHIEF COMPLAINT:   Chief Complaint  Patient presents with  . Altered Mental Status  . Respiratory Arrest  . Cardiac Arrest    HISTORY OF PRESENT ILLNESS:  Bobby Gray  is a 65 y.o. male who presents with respiratory arrest and unresponsive state. Patient was found at home for unknown amount of time unresponsive. He was brought in to the ED. Had acute respiratory failure requiring intubation. CT head quickly revealed intracranial hemorrhage near his brainstem. Neurosurgery was called and stated that his articular hemorrhage is not treatable and unsurvivable. Critical care was consulted, family opted for comfort measures. Hospitalists were called for admission.  PAST MEDICAL HISTORY:   Past Medical History  Diagnosis Date  . CHF (congestive heart failure) (HCC)     a. previously systolic, now diastolic.  . Atrial flutter ([place])     a. 12/2008 s/p Aflutter RFCA.  Marland Kitchen Coronary artery disease     a. 12/2008 Cath: nonobs CAD.  Marland Kitchen Aortic insufficiency     a. 08/2012 Echo: Mild AI.  Marland Kitchen Ascending aortic aneurysm (HCC)     a. moderately dilated to 4.6 cm at the Sinuses of Valsalva. ascending aorta was mildly dilated to 4.2 cm - stable.  . Hypertension   . Hyperlipidemia   . Tobacco abuse   . Chronic bronchitis ([place])   . Cardiomyopathy ([place])     a. Presumed to have been tachycardia mediated - 10/2008 Echo: EF 15%;  b. 08/2012 Echo: EF 60-65%, mod LVH, mild AI, mod bi-atrial enlargement.  Marland Kitchen PAF (paroxysmal atrial fibrillation) ([place])   . CKD (chronic kidney disease), stage II   . COPD (chronic obstructive pulmonary disease) ([place])   . Shortness of breath dyspnea     PAST SURGICAL HISTORY:   Past Surgical History  Procedure  Laterality Date  . Esophagogastroduodenoscopy  10/2002    H. Pylori via biopsy Nicolasa Ducking)  . Colonoscopy w/ polypectomy  10/2002    Nicolasa Ducking  . Ct abd w & pelvis wo cm  09/29/2006    Chest w/ nml  . Ct thoracic spine  09/29/2006    w/ nml  . Spect ett  06/15/2007    Mod inf defect w/o ischemia depr LVF (Dr Clayborn Bigness)  . Tee without cardioversion  11/14/2008    LVE EF 15% Diffuse hypokinesis mild-mod AR LAE RAE mod TR  . Cardiac catheterization  01/11/2009    Mild- mod AI, EF 55% 30-40% stenosis first diag LAD  . Colonoscopy with propofol N/A 05/16/2015    Procedure: COLONOSCOPY WITH PROPOFOL;  Surgeon: Josefine Class, MD;  Location: [facility] ENDOSCOPY;  Service: Endoscopy;  Laterality: N/A;    SOCIAL HISTORY:   Social History  Substance Use Topics  . Smoking status: Current Every Day Smoker -- 0.25 packs/day for 25 years    Types: Cigarettes  . Smokeless tobacco: Never Used     Comment: Recently decreased to 7 cigs/week  . Alcohol Use: No    FAMILY HISTORY:   Family History  Problem Relation Age of Onset  . Heart failure Father   . Hypertension Father   . Atrial fibrillation Brother   . Arrhythmia Brother     A fib, cardiomeg has defibrillator  . Kidney failure  Brother     declined dialysis  . Kidney disease Brother     Kidney failure, declined dialysis  . Diabetes Neg Hx   . Heart attack Neg Hx   . Breast cancer Neg Hx   . Ovarian cancer Neg Hx   . Uterine cancer Neg Hx   . Alcohol abuse Neg Hx   . Depression Neg Hx   . Stroke Neg Hx   . Aneurysm Mother 68    brain  . Heart disease Brother   . Heart disease Brother     DRUG ALLERGIES:  No Known Allergies  MEDICATIONS AT HOME:   Prior to Admission medications   Medication Sig Start Date End Date Taking? Authorizing Provider  acetaminophen (TYLENOL) 500 MG tablet Take 500 mg by mouth every 6 (six) hours as needed.    Historical Provider, MD  aspirin 81 MG EC tablet Take 81 mg by mouth daily.      Historical  Provider, MD  atorvastatin (LIPITOR) 20 MG tablet Take 1 tablet (20 mg total) by mouth daily. 03/28/15   Alisa Graff, FNP  buPROPion (WELLBUTRIN SR) 150 MG 12 hr tablet Take 150 mg by mouth 2 (two) times daily.    Historical Provider, MD  carvedilol (COREG) 25 MG tablet Take 50 mg by mouth 2 (two) times daily with a meal.    Historical Provider, MD  cetirizine (ZYRTEC) 10 MG tablet Take 10 mg by mouth daily.    Historical Provider, MD  fish oil-omega-3 fatty acids 1000 MG capsule Take 2 g by mouth daily.    Historical Provider, MD  fluticasone (FLONASE) 50 MCG/ACT nasal spray Place into both nostrils daily.    Historical Provider, MD  Fluticasone-Salmeterol (ADVAIR) 250-50 MCG/DOSE AEPB Inhale 1 puff into the lungs every 12 (twelve) hours.    Historical Provider, MD  furosemide (LASIX) 40 MG tablet Take 40 mg by mouth daily.  12/04/11   Larey Dresser, MD  Ipratropium-Albuterol (COMBIVENT) 20-100 MCG/ACT AERS respimat Inhale 1 puff into the lungs every 6 (six) hours.    Historical Provider, MD  isosorbide-hydrALAZINE (BIDIL) 20-37.5 MG per tablet Take 1 tablet by mouth 3 (three) times daily. 05/17/14   Alisa Graff, FNP  nitroGLYCERIN (NITROSTAT) 0.4 MG SL tablet Place 0.4 mg under the tongue every 5 (five) minutes as needed.      Historical Provider, MD  sacubitril-valsartan (ENTRESTO) 49-51 MG Take 1 tablet by mouth 2 (two) times daily. 06/12/15   Alisa Graff, FNP    REVIEW OF SYSTEMS:  Review of Systems  Unable to perform ROS: acuity of condition     VITAL SIGNS:   Filed Vitals:   Jul 24, 2015 1600 24-Jul-2015 1615 Jul 24, 2015 1647 24-Jul-2015 1733  BP: 147/90 153/95 126/80 88/60  Pulse: 41 61 62 65  Temp:      TempSrc:      Resp: 24 24 24 24   Height:      Weight:      SpO2: 97% 99% 100% 100%   Wt Readings from Last 3 Encounters:  Jul 24, 2015 112 kg (246 lb 14.6 oz)  06/12/15 107.502 kg (237 lb)  05/16/15 101.606 kg (224 lb)    PHYSICAL EXAMINATION:  Physical Exam  Vitals  reviewed. Constitutional: He appears well-developed and well-nourished. No distress.  HENT:  Head: Normocephalic and atraumatic.  Mouth/Throat: Oropharynx is clear and moist.  Eyes: Conjunctivae are normal. No scleral icterus.  Pupils equal, dilated, nonreactive.  Neck: Normal range of motion. Neck supple. No  JVD present. No thyromegaly present.  Cardiovascular: Normal rate, regular rhythm and intact distal pulses.  Exam reveals no gallop and no friction rub.   No murmur heard. Respiratory: He is in respiratory distress (patient is intubated). He has no wheezes. He has rales (Bilateral coarse breath sounds).  GI: Soft. Bowel sounds are normal. He exhibits no distension. There is no tenderness.  Musculoskeletal: Normal range of motion. He exhibits no edema.  No arthritis, no gout  Lymphadenopathy:    He has no cervical adenopathy.  Neurological:  Patient is unresponsive. Has no withdrawal to pain. Unable to further assess due to his critical condition.  Skin: Skin is warm and dry. No rash noted. No erythema.  Psychiatric:  Unable to assess due to patient's condition.    LABORATORY PANEL:   CBC  Recent Labs Lab 08/04/15 1218  WBC 9.6  HGB 19.2*  HCT 60.1*  PLT 208   ------------------------------------------------------------------------------------------------------------------  Chemistries   Recent Labs Lab 2015/08/04 1218  NA 140  K 4.8  CL 104  CO2 27  GLUCOSE 177*  BUN 12  CREATININE 1.73*  CALCIUM 8.6*  AST 28  ALT 21  ALKPHOS 67  BILITOT 0.9   ------------------------------------------------------------------------------------------------------------------  Cardiac Enzymes  Recent Labs Lab 04-Aug-2015 1218  TROPONINI 0.03   ------------------------------------------------------------------------------------------------------------------  RADIOLOGY:  Dg Abd 1 View  August 04, 2015  CLINICAL DATA:  Intubation, orogastric tube adjustment EXAM: ABDOMEN - 1  VIEW COMPARISON:  Portable exam 1416 hours compared to 08/04/2015 at 1236 hours FINDINGS: External pacing leads project over chest. Nasogastric tube is not well visualized. Questionable visualization of nasogastric tip projecting over stomach. Bowel gas pattern normal. Gaseous distention of stomach seen on previous exam no longer identified. IMPRESSION: Questionable visualization of tip of nasogastric tube projecting over stomach. Resolution of previously identified gaseous distention of the stomach. Electronically Signed   By: Lavonia Dana M.D.   On: 2015/08/04 14:36   Dg Abd 1 View  08/04/2015  CLINICAL DATA:  65 year old male with OG tube placement. EXAM: ABDOMEN - 1 VIEW COMPARISON:  None. FINDINGS: The tip of an OG tube is identified in the region of the distal esophagus -recommend advancement. Mild gaseous distension of the stomach is noted. IMPRESSION: Tip of OG tube lies over the distal esophageal region-recommend advancement. Gastric distention. Electronically Signed   By: Margarette Canada M.D.   On: 2015/08/04 12:53   Ct Head Wo Contrast  2015-08-04  CLINICAL DATA:  Found down at home. EXAM: CT HEAD WITHOUT CONTRAST TECHNIQUE: Contiguous axial images were obtained from the base of the skull through the vertex without intravenous contrast. COMPARISON:  None. FINDINGS: Large rounded collection of acute hemorrhage centered within the pons measures 4.4 x 2.9 cm, with upwards extension into the midbrain. Suspect associated edema within the pons and midbrain. This acute hemorrhage also extends posteriorly into the fourth ventricle. Additional smaller amount of hemorrhage is present within the third ventricle and lateral ventricles bilaterally. No associated hydrocephalus. No other site of parenchymal hemorrhage identified. No epidural or subdural hemorrhage. Mild chronic small vessel ischemic changes are seen within the bilateral periventricular and subcortical white matter regions. No supratentorial edema or  mass effect identified. No midline shift or herniation. No osseous fracture or dislocation. Superficial soft tissues are unremarkable. IMPRESSION: 1. Large rounded collection of acute hemorrhage centered within the pons, with upwards extension to the midbrain, measuring 4.4 x 2.9 cm at the level of the pons. Probable associated edema within the pons and midbrain. This hemorrhage and  edema is suspected to be related to hemorrhagic infarction or hypertensive hemorrhage. Alternative considerations include vascular malformation and ruptured aneurysm and would, therefore, consider further characterization with CT angiogram or MR angiogram. 2. Associated hemorrhage extending into the fourth ventricle, third ventricle and lateral ventricles. No associated hydrocephalus. 3. No midline shift or herniation. 4. After further review, there is an additional tiny focus of acute hemorrhage also noted within the left thalamus. Critical Value/emergent results were discussed by telephone at the time of interpretation on Jul 10, 2015 at 1:30 pm with Dr. Loura Pardon , who verbally acknowledged these results. Electronically Signed   By: Franki Cabot M.D.   On: 2015-07-10 13:36   Dg Chest Portable 1 View  2015/07/10  CLINICAL DATA:  Check endotracheal tube and gastric catheter placement EXAM: PORTABLE CHEST - 1 VIEW COMPARISON:  07/10/2015 FINDINGS: Cardiac shadow is mildly enlarged but stable. Vascular congestion is again seen. An endotracheal tube and nasogastric catheter are again seen. The endotracheal tube is been advanced and now lies 5.5 cm above the carina at the level of the aortic arch. IMPRESSION: Stable vascular congestion. Tubes and lines as described. Electronically Signed   By: Inez Catalina M.D.   On: 2015-07-10 14:36   Dg Chest Portable 1 View  10-Jul-2015  CLINICAL DATA:  Unresponsive EXAM: PORTABLE CHEST 1 VIEW COMPARISON:  03/08/2014 FINDINGS: Moderate cardiomegaly. Vascular congestion. No sign of pulmonary  edema. No pneumothorax. NG tube is in place. It becomes obscured over the lower mediastinum. Endotracheal tube tip is 11.5 cm from the carina. IMPRESSION: Cardiomegaly and vascular congestion. Endotracheal and NG tubes as described. Electronically Signed   By: Marybelle Killings M.D.   On: 07/10/15 12:54    EKG:   Orders placed or performed during the hospital encounter of 2015/07/10  . ED EKG 12-Lead  . ED EKG 12-Lead    IMPRESSION AND PLAN:  Principal Problem:   Intracranial hemorrhage (HCC) - with associated cerebral edema. per neurosurgery completing the size of his brainstem and his non-treatable and unsurvivable. Patient and family have understood this and opted for comfort measures. Patient will be admitted with comfort measures and the plan is for him to be extubated once all family members have arrived. End-of-life order set utilized for comfort measures. Active Problems:   Acute respiratory failure with hypoxia (HCC) - patient is intubated for acute history failure. However, given the above decision to pursue comfort measures he will be extubated once all family members have arrived.   Essential hypertension - blood pressure is low at this time and slowly trending down. Holding all home meds and comfort measures.   Coronary artery disease - holding all home meds   COPD (chronic obstructive pulmonary disease) (HCC) - holding all meds, except for comfort measures.  All the records are reviewed and case discussed with ED provider. Management plans discussed with the patient and/or family.  DVT PROPHYLAXIS: None  GI PROPHYLAXIS: None  ADMISSION STATUS: Inpatient  CODE STATUS: DNR  TOTAL TIME TAKING CARE OF THIS PATIENT: 40 minutes.    Dorlisa Savino [place] 07/10/15, 5:34 PM  [facility]  Office  [phone]  CC: Primary care physician; Volanda Napoleon, MD

## 2015-07-21 NOTE — ED Notes (Signed)
Pt being extubated at this time by RT Donnelly Angelica and [employer] All family members at bedside. MD Jannifer Franklin made aware, no further orders given at this time.

## 2015-07-21 NOTE — ED Notes (Signed)
MD Edd Fabian at bedside updating family at this time regarding pt's status.

## 2015-07-21 NOTE — ED Notes (Addendum)
Pt time of death 1905. MD Jannifer Franklin made aware, no further orders given at this time, family at bedside.

## 2015-07-21 NOTE — ED Notes (Addendum)
Neurologist at bedside at this time updating pt's family regarding pt's status

## 2015-07-21 NOTE — ED Notes (Signed)
Pt had second run of Vtach, pressure in the 80's. MD Jannifer Franklin made aware, treating pt as comfort care only at this time. No further orders given at this time. Waiting on pt's sister to arrived to extubate pt per request of family members. Family at bedside at this time

## 2015-07-21 DEATH — deceased
